# Patient Record
Sex: Female | Born: 1985 | Race: White | Hispanic: No | Marital: Married | Smoking: Former smoker
Health system: Southern US, Community
[De-identification: ages and names within clinical notes are randomized; demographics above are authoritative.]

## PROBLEM LIST (undated history)

## (undated) DIAGNOSIS — G43909 Migraine, unspecified, not intractable, without status migrainosus: Secondary | ICD-10-CM

## (undated) DIAGNOSIS — R42 Dizziness and giddiness: Secondary | ICD-10-CM

## (undated) DIAGNOSIS — F410 Panic disorder [episodic paroxysmal anxiety] without agoraphobia: Secondary | ICD-10-CM

## (undated) DIAGNOSIS — F419 Anxiety disorder, unspecified: Secondary | ICD-10-CM

## (undated) HISTORY — PX: OVARIAN CYST REMOVAL: SHX89

## (undated) HISTORY — PX: WISDOM TOOTH EXTRACTION: SHX21

---

## 2006-04-18 ENCOUNTER — Inpatient Hospital Stay: Payer: Self-pay

## 2013-04-29 ENCOUNTER — Ambulatory Visit: Payer: Self-pay | Admitting: Obstetrics and Gynecology

## 2013-04-29 LAB — CBC WITH DIFFERENTIAL/PLATELET
BASOS ABS: 0 10*3/uL (ref 0.0–0.1)
Basophil %: 0.4 %
EOS ABS: 0.1 10*3/uL (ref 0.0–0.7)
EOS PCT: 1.3 %
HCT: 41.2 % (ref 35.0–47.0)
HGB: 14.3 g/dL (ref 12.0–16.0)
LYMPHS ABS: 3.4 10*3/uL (ref 1.0–3.6)
Lymphocyte %: 33.6 %
MCH: 30.5 pg (ref 26.0–34.0)
MCHC: 34.6 g/dL (ref 32.0–36.0)
MCV: 88 fL (ref 80–100)
MONOS PCT: 5.4 %
Monocyte #: 0.5 x10 3/mm (ref 0.2–0.9)
Neutrophil #: 5.9 10*3/uL (ref 1.4–6.5)
Neutrophil %: 59.3 %
Platelet: 244 10*3/uL (ref 150–440)
RBC: 4.67 10*6/uL (ref 3.80–5.20)
RDW: 13.3 % (ref 11.5–14.5)
WBC: 10 10*3/uL (ref 3.6–11.0)

## 2013-04-29 LAB — URINALYSIS, COMPLETE
BILIRUBIN, UR: NEGATIVE
BLOOD: NEGATIVE
Glucose,UR: NEGATIVE mg/dL (ref 0–75)
Hyaline Cast: 6
Ketone: NEGATIVE
LEUKOCYTE ESTERASE: NEGATIVE
NITRITE: NEGATIVE
PH: 7 (ref 4.5–8.0)
PROTEIN: NEGATIVE
RBC,UR: 2 /HPF (ref 0–5)
Specific Gravity: 1.016 (ref 1.003–1.030)

## 2013-04-29 LAB — COMPREHENSIVE METABOLIC PANEL
ALT: 17 U/L (ref 12–78)
ANION GAP: 5 — AB (ref 7–16)
Albumin: 3.9 g/dL (ref 3.4–5.0)
Alkaline Phosphatase: 55 U/L
BILIRUBIN TOTAL: 0.5 mg/dL (ref 0.2–1.0)
BUN: 14 mg/dL (ref 7–18)
CALCIUM: 8.8 mg/dL (ref 8.5–10.1)
CHLORIDE: 106 mmol/L (ref 98–107)
CREATININE: 1.02 mg/dL (ref 0.60–1.30)
Co2: 26 mmol/L (ref 21–32)
EGFR (African American): >60
Glucose: 95 mg/dL (ref 65–99)
Osmolality: 274 (ref 275–301)
Potassium: 3.4 mmol/L — ABNORMAL LOW (ref 3.5–5.1)
SGOT(AST): 19 U/L (ref 15–37)
Sodium: 137 mmol/L (ref 136–145)
Total Protein: 6.9 g/dL (ref 6.4–8.2)

## 2013-04-29 LAB — LIPASE, BLOOD: LIPASE: 105 U/L (ref 73–393)

## 2013-05-04 LAB — PATHOLOGY REPORT

## 2014-08-05 NOTE — Op Note (Signed)
PATIENT NAME:  Tiffany Cuevas, Tiffany Cuevas MR#:  [id number] DATE OF BIRTH:  1986-02-11  DATE OF PROCEDURE:  04/29/2013  PREOPERATIVE DIAGNOSES: 1.  Right ovarian cyst (8 cm x 7 cm x 6 cm).  2.  Acute abdominal pain.  3.  Suspect ovarian torsion.   POSTOPERATIVE DIAGNOSES:  1.  Right ovarian cyst (8 cm x 7 cm x 6 cm).  2.  Acute abdominal pain.  3.  Suspect ovarian torsion.   PROCEDURES:  1.  Operative laparoscopy.  2.  Right ovarian cystectomy.   SURGEON: Thomasene MohairStephen Malie Kashani, MD   ASSISTANT SURGEON: Corky CraftsBrittany Pjetraj, PA-S  ANESTHESIA: General.  ESTIMATED BLOOD LOSS: 25 mL.  OPERATIVE FLUIDS: 80 mL.   COMPLICATIONS: None.   FINDINGS: 1.  Large right simple ovarian cyst.  2.  Torsed appearing infundibulopelvic ligament.  3.  Otherwise normal-appearing pelvic anatomy.  4.  Normal appearing appendix.   SPECIMEN: Right ovarian cyst.   CONDITION AT THE END OF PROCEDURE: Stable.   PROCEDURE IN DETAIL: The patient was taken to the operating room where general anesthesia was administered and found to be adequate. She was placed in the dorsal supine lithotomy position in [place] stirrups and prepped and draped in the usual sterile fashion. After Cuevas timeout was called, Foley catheter was placed in her bladder for decompression during the surgery. The sterile speculum was placed in the vagina and Cuevas single-tooth tenaculum was affixed to the anterior lip of the cervix. An acorn uterine manipulator was then affixed to the tenaculum.   Attention was turned to the abdomen where after injection of local anesthetic Cuevas 5 mm infraumbilical incision was made with Cuevas scalpel and the abdomen was entered using direct camera visualization using an Optiview trocar. Entry into the abdomen was verified using opening pressures. The abdomen was then insufflated with CO2. The camera was then re-introduced through the port and atraumatic entry was verified. Survey of the pelvis and abdomen was undertaken with the above-noted  findings. Right and left lower quadrant ports, both were 5 mm, were placed under direct intra-abdominal camera visualization without difficulty. The right ovary was elevated and de-torsed and there was noted to be no duskiness or apparent ischemia of the ovarian tissue. The ovarian cyst was opened up and the fluid was drained and evacuated using the suction irrigator. The majority of the cyst was removed including part of the ovarian capsule with the majority of the ovary still intact. The remaining ovary had the cyst wall removed from it and hemostasis was verified and obtained. The specimen was placed in the anterior cul-de-sac while general inspection of the rest of the pelvis and abdomen was undertaken. Once this was accomplished, the umbilical port was changed to Cuevas 12 mm port was placed and an Endo Catch bag was introduced through the umbilical port and the specimen was placed into the Endo Catch bag. The Endo Catch bag was then able to be removed directly through the port without having to remove the port. The camera was reintroduced through the umbilical port and the pelvis was copiously irrigated and evacuated of fluid. Hemostasis was noted at the ovary and good ovarian color was noted with ovary in normal anatomic position at the end of the case. This concluded the procedure. All port sites were removed and after desufflation of the abdomen each incision site was closed subcutaneously using 3-0 Vicryl and the skin was closed using Dermabond. Cuevas total of 18 mL of 0.5% Marcaine was injected divided along all 3 sites.  Once the abdominal portion of the procedure was terminated, Cuevas speculum was placed again in the vagina and acorn manipulator was removed from the tenaculum. The tenaculum was removed and using 1 silver nitrate stick one of the tenaculum sites required silver nitrate for hemostasis. The speculum was also removed and verification of absence of instruments that were in the vagina was verified. The  Foley catheter was removed also at the end of the procedure.   The patient tolerated the procedure well. Sponge, lap, and needle counts were correct x2. The patient was wearing pneumatic compression stockings throughout the entire procedure for VTE prophylaxis. She was awakened in the operating room and taken to the recovery area in stable condition.  ____________________________ Conard Novak, MD sdj:sb D: 04/29/2013 15:47:04 ET T: 04/29/2013 16:56:39 ET JOB#: [id number]  cc: Conard Novak, MD, <Dictator> Conard Novak MD ELECTRONICALLY SIGNED 05/20/2013 17:17

## 2014-08-22 NOTE — H&P (Signed)
L&D Evaluation:  History Expanded:  HPI 29 yo G2P2 healthy female who presents with sudden onset right lower quadrant pain at about 230 am today.  She had mild discomfort last night. She has never had this type of pain before.  She describes the pain as sharp/stabbling and radiating to her right flank and back.  No alleviating and no aggravating factors. Pain is 10/10. She notes some chills, but no fever.  +nausea, no emesis.  No diarrhea/constipation.  She had "pressure" symptoms when attempting to void.  She has an IUD in place for contraception.  She does not have a firm last menstrual period date.  She has had a couple of doses of morphine which have relieved the pain.   Patient's Medical History migraines   Patient's Surgical History none   Medications None   Allergies 1) Percocet- nausea/vomiting, 2) amoxicillin   Social History none   Family History Non-Contributory   ROS:  ROS All systems were reviewed.  HEENT, CNS, GI, GU, Respiratory, CV, Renal and Musculoskeletal systems were found to be normal., x 10 reviewed unless noted in HPI   Exam:  Vital Signs T 97.8, BP 99/55, P 93, 99%RA   General no apparent distress   Mental Status clear   Abdomen soft, mildly tender to palpation in her right lower quadrant, no rebound or guarding   Edema no edema   Skin no lesions   Other CT: 8x7x6cm right ovarian cyst with a possible single septation Pelvic U/S: confirms CT findings. Doppler shows arterial and venous flow without obvious surrounding edema.   Impression:  Impression right ovarian cyst, concern for ovarian torsion.   Plan:  Comments Discussed ovarian torsion as possible etiology to her symptoms and discussed risks/benefits of diagnostic and possibly therapeutic laparoscopy to remove or decompress cyst. Patient and husband had opportunity to discuss and have elected for Operative laparoscopy with removal/decompression of right ovarian cyst.  Patient last ate at around  8pm last night.  She has been NPO since then. Discussed risks/benefits of surgery.  Discussed recovery and what to expect post-operatively.   Reviewed blood consent form verbally and patient agrees to blood transfusion should one be necessary emergently.   Labs:  Lab Results:  Hepatic:  16-Jan-15 03:38   Bilirubin, Total 0.5  Alkaline Phosphatase 55 (45-117 NOTE: New Reference Range 03/04/13)  SGPT (ALT) 17  SGOT (AST) 19  Total Protein, Serum 6.9  Albumin, Serum 3.9  Routine Chem:  16-Jan-15 03:38   Lipase 105 (Result(s) reported on 29 Apr 2013 at 04:24AM.)  Glucose, Serum 95  BUN 14  Creatinine (comp) 1.02  Sodium, Serum 137  Potassium, Serum  3.4  Chloride, Serum 106  CO2, Serum 26  Calcium (Total), Serum 8.8  Osmolality (calc) 274  eGFR (African American) >60  eGFR (Non-African American) >60 (eGFR values <81m/min/1.73 m2 may be an indication of chronic kidney disease (CKD). Calculated eGFR is useful in patients with stable renal function. The eGFR calculation will not be reliable in acutely ill patients when serum creatinine is changing rapidly. It is not useful in  patients on dialysis. The eGFR calculation may not be applicable to patients at the low and high extremes of body sizes, pregnant women, and vegetarians.)  Anion Gap  5  Routine UA:  16-Jan-15 03:38   Color (UA) Yellow  Clarity (UA) Cloudy  Glucose (UA) Negative  Bilirubin (UA) Negative  Ketones (UA) Negative  Specific Gravity (UA) 1.016  Blood (UA) Negative  pH (UA) 7.0  Protein (UA) Negative  Nitrite (UA) Negative  Leukocyte Esterase (UA) Negative (Result(s) reported on 29 Apr 2013 at 04:39AM.)  RBC (UA) 2 /HPF  WBC (UA) 2 /HPF  Bacteria (UA) 2+  Epithelial Cells (UA) 2 /HPF  Mucous (UA) PRESENT  Hyaline Cast (UA) 6 /LPF  Amorphous Crystal (UA) PRESENT (Result(s) reported on 29 Apr 2013 at 04:39AM.)  Routine Hem:  16-Jan-15 03:38   WBC (CBC) 10.0  RBC (CBC) 4.67  Hemoglobin (CBC) 14.3   Hematocrit (CBC) 41.2  Platelet Count (CBC) 244  MCV 88  MCH 30.5  MCHC 34.6  RDW 13.3  Neutrophil % 59.3  Lymphocyte % 33.6  Monocyte % 5.4  Eosinophil % 1.3  Basophil % 0.4  Neutrophil # 5.9  Lymphocyte # 3.4  Monocyte # 0.5  Eosinophil # 0.1  Basophil # 0.0 (Result(s) reported on 29 Apr 2013 at 04:24AM.)   Electronic Signatures: Will Bonnet (MD)  (Signed 16-Jan-15 09:51)  Authored: L&D Evaluation, Labs   Last Updated: 16-Jan-15 09:51 by Will Bonnet (MD)

## 2015-02-21 ENCOUNTER — Emergency Department
Admission: EM | Admit: 2015-02-21 | Discharge: 2015-02-22 | Disposition: A | Discharge status: Home / Self Care | Payer: BC Managed Care – PPO | Attending: Emergency Medicine | Admitting: Emergency Medicine

## 2015-02-21 ENCOUNTER — Encounter: Payer: Self-pay | Admitting: *Deleted

## 2015-02-21 DIAGNOSIS — G43909 Migraine, unspecified, not intractable, without status migrainosus: Secondary | ICD-10-CM | POA: Diagnosis present

## 2015-02-21 DIAGNOSIS — G43009 Migraine without aura, not intractable, without status migrainosus: Secondary | ICD-10-CM

## 2015-02-21 HISTORY — DX: Migraine, unspecified, not intractable, without status migrainosus: G43.909

## 2015-02-21 NOTE — ED Notes (Addendum)
Pt reports a migraine headache for 1 day.  states headache feels different from others.  Pt has taken 3 imitrex without relief today.  Pt also has nausea   Pt alert.  Speech clear.

## 2015-02-22 ENCOUNTER — Encounter: Payer: Self-pay | Admitting: Emergency Medicine

## 2015-02-22 ENCOUNTER — Emergency Department: Payer: BC Managed Care – PPO

## 2015-02-22 ENCOUNTER — Encounter: Payer: Self-pay | Admitting: *Deleted

## 2015-02-22 ENCOUNTER — Emergency Department
Admission: EM | Admit: 2015-02-22 | Discharge: 2015-02-22 | Disposition: A | Discharge status: Home / Self Care | Payer: BC Managed Care – PPO | Source: Home / Self Care | Attending: Emergency Medicine | Admitting: Emergency Medicine

## 2015-02-22 DIAGNOSIS — G43909 Migraine, unspecified, not intractable, without status migrainosus: Secondary | ICD-10-CM | POA: Diagnosis not present

## 2015-02-22 DIAGNOSIS — G43009 Migraine without aura, not intractable, without status migrainosus: Secondary | ICD-10-CM

## 2015-02-22 MED ORDER — ONDANSETRON 4 MG PO TBDP
ORAL_TABLET | ORAL | Status: AC
  Filled 2015-02-22: qty 1

## 2015-02-22 MED ORDER — ONDANSETRON HCL 4 MG/2ML IJ SOLN
4.0000 mg | Freq: Once | INTRAMUSCULAR | Status: AC
Start: 2015-02-22 — End: 2015-02-22
  Administered 2015-02-22: 4 mg via INTRAVENOUS

## 2015-02-22 MED ORDER — ACETAMINOPHEN 500 MG PO TABS
1000.0000 mg | ORAL_TABLET | Freq: Once | ORAL | Status: AC
  Administered 2015-02-22: 1000 mg via ORAL
  Filled 2015-02-22: qty 2

## 2015-02-22 MED ORDER — SODIUM CHLORIDE 0.9 % IV BOLUS (SEPSIS)
1000.0000 mL | Freq: Once | INTRAVENOUS | Status: AC
  Administered 2015-02-22: 1000 mL via INTRAVENOUS

## 2015-02-22 MED ORDER — METOCLOPRAMIDE HCL 5 MG/ML IJ SOLN
20.0000 mg | Freq: Once | INTRAVENOUS | Status: AC
  Administered 2015-02-22: 20 mg via INTRAVENOUS
  Filled 2015-02-22: qty 4

## 2015-02-22 MED ORDER — ONDANSETRON 4 MG PO TBDP
4.0000 mg | ORAL_TABLET | Freq: Once | ORAL | Status: AC | PRN
  Administered 2015-02-22: 4 mg via ORAL

## 2015-02-22 MED ORDER — OXYCODONE-ACETAMINOPHEN 5-325 MG PO TABS
2.0000 | ORAL_TABLET | Freq: Once | ORAL | Status: AC
  Administered 2015-02-22: 2 via ORAL
  Filled 2015-02-22: qty 2

## 2015-02-22 MED ORDER — ONDANSETRON 4 MG PO TBDP: 4.0000 mg | ORAL_TABLET | Freq: Three times a day (TID) | ORAL | Status: DC | PRN

## 2015-02-22 MED ORDER — KETOROLAC TROMETHAMINE 30 MG/ML IJ SOLN
30.0000 mg | Freq: Once | INTRAMUSCULAR | Status: AC
  Administered 2015-02-22: 30 mg via INTRAVENOUS
  Filled 2015-02-22: qty 1

## 2015-02-22 MED ORDER — OXYCODONE-ACETAMINOPHEN 5-325 MG PO TABS
1.0000 | ORAL_TABLET | Freq: Once | ORAL | Status: AC
  Administered 2015-02-22: 1 via ORAL

## 2015-02-22 MED ORDER — ONDANSETRON HCL 4 MG/2ML IJ SOLN
INTRAMUSCULAR | Status: AC
  Administered 2015-02-22: 4 mg via INTRAVENOUS
  Filled 2015-02-22: qty 2

## 2015-02-22 MED ORDER — DIPHENHYDRAMINE HCL 50 MG/ML IJ SOLN
25.0000 mg | Freq: Once | INTRAMUSCULAR | Status: AC
  Administered 2015-02-22: 25 mg via INTRAVENOUS
  Filled 2015-02-22: qty 1

## 2015-02-22 MED ORDER — OXYCODONE-ACETAMINOPHEN 5-325 MG PO TABS: 1.0000 | ORAL_TABLET | ORAL | Status: DC | PRN

## 2015-02-22 MED ORDER — SODIUM CHLORIDE 0.9 % IV BOLUS (SEPSIS)
500.0000 mL | INTRAVENOUS | Status: AC
  Administered 2015-02-22: 500 mL via INTRAVENOUS

## 2015-02-22 MED ORDER — OXYCODONE-ACETAMINOPHEN 5-325 MG PO TABS
ORAL_TABLET | ORAL | Status: AC
  Filled 2015-02-22: qty 1

## 2015-02-22 MED ORDER — ONDANSETRON 4 MG PO TBDP
4.0000 mg | ORAL_TABLET | Freq: Once | ORAL | Status: AC
  Administered 2015-02-22: 4 mg via ORAL
  Filled 2015-02-22: qty 1

## 2015-02-22 MED ORDER — MORPHINE SULFATE (PF) 4 MG/ML IV SOLN
4.0000 mg | Freq: Once | INTRAVENOUS | Status: AC
  Administered 2015-02-22: 4 mg via INTRAVENOUS

## 2015-02-22 MED ORDER — MORPHINE SULFATE (PF) 4 MG/ML IV SOLN
INTRAVENOUS | Status: AC
  Administered 2015-02-22: 4 mg via INTRAVENOUS
  Filled 2015-02-22: qty 1

## 2015-02-22 MED ORDER — DEXAMETHASONE SODIUM PHOSPHATE 10 MG/ML IJ SOLN
10.0000 mg | Freq: Once | INTRAMUSCULAR | Status: AC
  Administered 2015-02-22: 10 mg via INTRAVENOUS
  Filled 2015-02-22: qty 1

## 2015-02-22 NOTE — ED Notes (Signed)
Patient discharged to home per MD order. Patient in stable condition, and deemed medically cleared by ED provider for discharge. Discharge instructions reviewed with patient/family using "Teach Back"; verbalized understanding of medication education and administration, and information about follow-up care. Denies further concerns. ° °

## 2015-02-22 NOTE — ED Notes (Addendum)
Pt was seen last night for a migraine, pt woke up this morning with a migraine and vomiting

## 2015-02-22 NOTE — ED Notes (Signed)
Pt to ct 

## 2015-02-22 NOTE — ED Provider Notes (Signed)
[facility] Emergency Department Provider Note  ____________________________________________  Time seen: 12:30 AM  I have reviewed the triage vital signs and the nursing notes.   HISTORY  Chief Complaint Migraine     HPI Karie ChimeraMegan A Prosperi is a 29 y.o. female presents with migraine headache times one day that is unrelieved with 3 doses of Imitrex. Patient also admits to nausea and dizziness. Patient states that this current headache is different from her migraines that she's been having since childhood. Patient also admits to photosensitivity     Past Medical History  Diagnosis Date  . Migraines     There are no active problems to display for this patient.   No past surgical history on file.  No current outpatient prescriptions on file.  Allergies Review of patient's allergies indicates no known allergies.  No family history on file.  Social History Social History  Substance Use Topics  . Smoking status: Never Smoker   . Smokeless tobacco: None  . Alcohol Use: No    Review of Systems  Constitutional: Negative for fever. Eyes: Negative for visual changes. ENT: Negative for sore throat. Cardiovascular: Negative for chest pain. Respiratory: Negative for shortness of breath. Gastrointestinal: Negative for abdominal pain, vomiting and diarrhea. Genitourinary: Negative for dysuria. Musculoskeletal: Negative for back pain. Skin: Negative for rash. Neurological: Positive for headache and dizziness   10-point ROS otherwise negative.  ____________________________________________   PHYSICAL EXAM:  VITAL SIGNS: ED Triage Vitals  Enc Vitals Group     BP 02/21/15 2205 131/91 mmHg     Pulse Rate 02/21/15 2205 85     Resp 02/21/15 2205 18     Temp 02/21/15 2205 97.8 F (36.6 C)     Temp Source 02/21/15 2205 Oral     SpO2 02/21/15 2205 99 %     Weight 02/21/15 2205 160 lb (72.576 kg)     Height 02/21/15 2205 5\' 3"  (1.6 m)     Head Cir  --      Peak Flow --      Pain Score 02/21/15 2207 9     Pain Loc --      Pain Edu? --      Excl. in GC? --      Constitutional: Alert and oriented. Apparent discomfort Eyes: Conjunctivae are normal. PERRL. Normal extraocular movements. ENT   Head: Normocephalic and atraumatic.   Nose: No congestion/rhinnorhea.   Mouth/Throat: Mucous membranes are moist.   Neck: No stridor. Hematological/Lymphatic/Immunilogical: No cervical lymphadenopathy. Cardiovascular: Normal rate, regular rhythm. Normal and symmetric distal pulses are present in all extremities. No murmurs, rubs, or gallops. Respiratory: Normal respiratory effort without tachypnea nor retractions. Breath sounds are clear and equal bilaterally. No wheezes/rales/rhonchi. Gastrointestinal: Soft and nontender. No distention. There is no CVA tenderness. Genitourinary: deferred Musculoskeletal: Nontender with normal range of motion in all extremities. No joint effusions.  No lower extremity tenderness nor edema. Neurologic:  Normal speech and language. No gross focal neurologic deficits are appreciated. Speech is normal.  Skin:  Skin is warm, dry and intact. No rash noted. Psychiatric: Mood and affect are normal. Speech and behavior are normal. Patient exhibits appropriate insight and judgment.    RADIOLOGY  CT Head Wo Contrast (Final result) Result time: 02/22/15 01:15:59   Final result by Rad Results In Interface (02/22/15 01:15:59)   Narrative:   CLINICAL DATA: Headache, dizziness, nausea  EXAM: CT HEAD WITHOUT CONTRAST  TECHNIQUE: Contiguous axial images were obtained from the base of the skull  through the vertex without intravenous contrast.  COMPARISON: None currently available (11/25/2003)  FINDINGS: Skull and Sinuses:Negative for fracture or destructive process. The mastoids, middle ears, and imaged paranasal sinuses are clear.  Orbits: No acute abnormality.  Brain: No infarction, hemorrhage,  hydrocephalus, or mass lesion/mass effect. No abnormal vessel density.  IMPRESSION: Normal head CT.   Electronically Signed By: Marnee Spring M.D. On: 02/22/2015 01:15      INITIAL IMPRESSION / ASSESSMENT AND PLAN / ED COURSE  Pertinent labs & imaging results that were available during my care of the patient were reviewed by me and considered in my medical decision making (see chart for details).  Patient received 4 mg IV morphine with resolution of headache.____________________________________________   FINAL CLINICAL IMPRESSION(S) / ED DIAGNOSES  Final diagnoses:  Migraine without aura and without status migrainosus, not intractable      Darci Current, MD 02/22/15 0200

## 2015-02-22 NOTE — ED Provider Notes (Signed)
[facility] Emergency Department Provider Note  ____________________________________________  Time seen: Approximately 2:04 PM  I have reviewed the triage vital signs and the nursing notes.   HISTORY  Chief Complaint Migraine    HPI Tiffany Cuevas is a 29 y.o. female who reports a history of "headaches since I was 29 years old", whose migraines are managed by her OB/GYN with Imitrex, and who presents to the emergency department yesterday for a gradual onset severe migraine yesterday.  She reports back to the emergency department today with recurrence of a migraine.  She reports that she has had nearly daily headaches for about one month.  She awoke with a headache yesterday morning which gradually got worse over the course of the day.  It is severe, bilateral, and extends from behind the eyes through to the back of her head.She reports severe photophobia, nausea, and a couple of episodes of vomiting.  Movement and light and noise make it worse and nothing makes it better.  The Imitrex has not helped.  When she was in the emergency department last night she improved after a dose of morphine 4 mg IV and Percocet but when she awoke this morning the headache was back and she vomited again.  She has not seen a neurologist for management of her migraines but is interested in doing so given her persistent issues.  She denies any blurry vision or other visual changes.  She states that this headache is much more severe than headaches with which she has been dealing even though she has been having problems for about one month now.  She denies any infectious symptoms including fever/chills, chest pain, shortness of breath, abdominal pain, and dysuria.  She uses an IUD for contraception.   Past Medical History  Diagnosis Date  . Migraines     There are no active problems to display for this patient.   History reviewed. No pertinent past surgical history.  Current Outpatient  Rx  Name  Route  Sig  Dispense  Refill  . ondansetron (ZOFRAN-ODT) 4 MG disintegrating tablet   Oral   Take 1 tablet (4 mg total) by mouth every 8 (eight) hours as needed for nausea or vomiting.   20 tablet   0   . oxyCODONE-acetaminophen (PERCOCET/ROXICET) 5-325 MG tablet   Oral   Take 1 tablet by mouth every 4 (four) hours as needed for severe pain.   15 tablet   0     Allergies Review of patient's allergies indicates no known allergies.  No family history on file.  Social History Social History  Substance Use Topics  . Smoking status: Never Smoker   . Smokeless tobacco: None  . Alcohol Use: No    Review of Systems Constitutional: No fever/chills Eyes: Severe photophobia.  No visual loss or blurry vision. ENT: No sore throat. Cardiovascular: Denies chest pain. Respiratory: Denies shortness of breath. Gastrointestinal: No abdominal pain.  Nausea and infrequent vomiting.  No diarrhea.  No constipation. Genitourinary: Negative for dysuria. Musculoskeletal: Negative for back pain. Skin: Negative for rash. Neurological: Severe global headache emanating from the front to the back  10-point ROS otherwise negative.  ____________________________________________   PHYSICAL EXAM:  VITAL SIGNS: ED Triage Vitals  Enc Vitals Group     BP 02/22/15 1115 110/77 mmHg     Pulse Rate 02/22/15 1115 82     Resp 02/22/15 1115 20     Temp 02/22/15 1115 97.7 F (36.5 C)     Temp  Source 02/22/15 1115 Oral     SpO2 02/22/15 1115 98 %     Weight 02/22/15 1115 160 lb (72.576 kg)     Height 02/22/15 1115  (1.6 m)     Head Cir --      Peak Flow --      Pain Score 02/22/15 1116 10     Pain Loc --      Pain Edu? --      Excl. in GC? --     Constitutional: Alert and oriented.  Appears uncomfortable and is shielding her eyes from the light  Eyes: Conjunctivae are normal. PERRL. EOMI. funduscopic exam is normal with no evidence of papilledema.  Positive for photophobia. Head:  Atraumatic. Nose: No congestion/rhinnorhea.  Sinuses nontender to palpation Mouth/Throat: Mucous membranes are moist.  Oropharynx non-erythematous. Neck: No stridor.  No meningismus Cardiovascular: Normal rate, regular rhythm. Grossly normal heart sounds.  Good peripheral circulation. Respiratory: Normal respiratory effort.  No retractions. Lungs CTAB. Gastrointestinal: Soft and nontender. No distention. No abdominal bruits. No CVA tenderness. Musculoskeletal: No lower extremity tenderness nor edema.  No joint effusions. Neurologic:  Normal speech and language. No gross focal neurologic deficits are appreciated.  Skin:  Skin is warm, dry and intact. No rash noted. Psychiatric: Mood and affect are flat. Speech and behavior are normal.  ____________________________________________   LABS (all labs ordered are listed, but only abnormal results are displayed)  Labs Reviewed - No data to display ____________________________________________  EKG  Not indicated ____________________________________________  RADIOLOGY  (CT FROM YESTERDAY'S VISIT) Ct Head Wo Contrast  02/22/2015  CLINICAL DATA:  Headache, dizziness, nausea EXAM: CT HEAD WITHOUT CONTRAST TECHNIQUE: Contiguous axial images were obtained from the base of the skull through the vertex without intravenous contrast. COMPARISON:  None currently available (11/25/2003) FINDINGS: Skull and Sinuses:Negative for fracture or destructive process. The mastoids, middle ears, and imaged paranasal sinuses are clear. Orbits: No acute abnormality. Brain: No infarction, hemorrhage, hydrocephalus, or mass lesion/mass effect. No abnormal vessel density. IMPRESSION: Normal head CT. Electronically Signed   By: Marnee Spring M.D.   On: 02/22/2015 01:15    ____________________________________________   PROCEDURES  Procedure(s) performed: None  Critical Care performed: No ____________________________________________   INITIAL IMPRESSION /  ASSESSMENT AND PLAN / ED COURSE  Pertinent labs & imaging results that were available during my care of the patient were reviewed by me and considered in my medical decision making (see chart for details).  Though it is possible that the patient has a different cause of headache other than migraine, it is less likely; she has been dealing with migraines her entire life, they have been present for nearly a month, she has no neurological deficits including no visual changes, she has no evidence of papilledema.  Differential diagnosis includes but is not exclusive to subarachnoid hemorrhage, meningitis, encephalitis, previous head trauma, psuedotumor cerebri, cavernous venous thrombosis, muscle tension headache, temporal arteritis, migraine or migraine equivalent, but the lack of infectious symptoms, the normal CT scan yesterday, and the description mentioned previously make the more severe causes significantly less likely.  The patient received opioids yesterday, but today I will treat her with a full "migraine cocktail" as per current up-to-date recommendations and then reassess.  ----------------------------------------- 4:24 PM on 02/22/2015 -----------------------------------------  The patient is currently sleeping comfortably.  I had a discussion about the difficulty in treating severe migraines with her husband.  I advised close follow-up with a neurologist to develop a care plan for her since the  Imitrex is not working as well as it used to.  Again, I considered other, possibly more severe diagnoses, but the non-intractable nature of her pain and all of the reassuring findings as described above make them much less likely, and though she was in pain when she arrives, she is generally well-appearing with normal vital signs and no neurological deficits.  I believe she is appropriate for outpatient follow-up and that the risks of an invasive procedure such as a lumbar puncture or expensive and likely  unnecessary testing such as an MRV outweigh the benefit to the patient given the very low probability of these other etiologies. ____________________________________________  FINAL CLINICAL IMPRESSION(S) / ED DIAGNOSES  Final diagnoses:  Nonintractable migraine, unspecified migraine type      NEW MEDICATIONS STARTED DURING THIS VISIT:  Discharge Medication List as of 02/22/2015  4:28 PM       Loleta Rose, MD 02/22/15 2239

## 2015-02-22 NOTE — ED Notes (Signed)
Pt in with co headache since this AM hx of migraines but states pain is different.  Pain radiates to left face and makes her face feel "puffy".  No obvious swelling noted, states pain is 8/10 at this time with nausea and photosensitivity. Has taken imitrex today without relief.  Also co dizziness which is unusual with her headaches.

## 2015-02-22 NOTE — ED Notes (Signed)
Called pharmacy to send patient medication. 

## 2015-02-22 NOTE — Discharge Instructions (Signed)

## 2015-02-22 NOTE — Discharge Instructions (Signed)
You have been seen in the Emergency Department (ED) for a migraine.  Please use Tylenol or Motrin as needed for symptoms, but only as written on the box, and take any regular medications that have been prescribed for you. °As we have discussed, please follow up with your doctor as soon as possible regarding today’s ED visit and your headache symptoms.   ° °Call your doctor or return to the Emergency Department (ED) if you have a worsening headache, sudden and severe headache, confusion, slurred speech, facial droop, weakness or numbness in any arm or leg, extreme fatigue, or other symptoms that concern you. ° ° °Migraine Headache °A migraine headache is an intense, throbbing pain on one or both sides of your head. A migraine can last for 30 minutes to several hours. °CAUSES  °The exact cause of a migraine headache is not always known. However, a migraine may be caused when nerves in the brain become irritated and release chemicals that cause inflammation. This causes pain. °Certain things may also trigger migraines, such as: °· Alcohol. °· Smoking. °· Stress. °· Menstruation. °· Aged cheeses. °· Foods or drinks that contain nitrates, glutamate, aspartame, or tyramine. °· Lack of sleep. °· Chocolate. °· Caffeine. °· Hunger. °· Physical exertion. °· Fatigue. °· Medicines used to treat chest pain (nitroglycerine), birth control pills, estrogen, and some blood pressure medicines. °SIGNS AND SYMPTOMS °· Pain on one or both sides of your head. °· Pulsating or throbbing pain. °· Severe pain that prevents daily activities. °· Pain that is aggravated by any physical activity. °· Nausea, vomiting, or both. °· Dizziness. °· Pain with exposure to bright lights, loud noises, or activity. °· General sensitivity to bright lights, loud noises, or smells. °Before you get a migraine, you may get warning signs that a migraine is coming (aura). An aura may include: °· Seeing flashing lights. °· Seeing bright spots, halos, or zigzag  lines. °· Having tunnel vision or blurred vision. °· Having feelings of numbness or tingling. °· Having trouble talking. °· Having muscle weakness. °DIAGNOSIS  °A migraine headache is often diagnosed based on: °· Symptoms. °· Physical exam. °· A CT scan or MRI of your head. These imaging tests cannot diagnose migraines, but they can help rule out other causes of headaches. °TREATMENT °Medicines may be given for pain and nausea. Medicines can also be given to help prevent recurrent migraines.  °HOME CARE INSTRUCTIONS °· Only take over-the-counter or prescription medicines for pain or discomfort as directed by your health care provider. The use of long-term narcotics is not recommended. °· Lie down in a dark, quiet room when you have a migraine. °· Keep a journal to find out what may trigger your migraine headaches. For example, write down: °¨ What you eat and drink. °¨ How much sleep you get. °¨ Any change to your diet or medicines. °· Limit alcohol consumption. °· Quit smoking if you smoke. °· Get 7-9 hours of sleep, or as recommended by your health care provider. °· Limit stress. °· Keep lights dim if bright lights bother you and make your migraines worse. °SEEK IMMEDIATE MEDICAL CARE IF:  °· Your migraine becomes severe. °· You have a fever. °· You have a stiff neck. °· You have vision loss. °· You have muscular weakness or loss of muscle control. °· You start losing your balance or have trouble walking. °· You feel faint or pass out. °· You have severe symptoms that are different from your first symptoms. °MAKE SURE YOU:  °·   Understand these instructions. °· Will watch your condition. °· Will get help right away if you are not doing well or get worse. °  °This information is not intended to replace advice given to you by your health care provider. Make sure you discuss any questions you have with your health care provider. °  °Document Released: 03/31/2005 Document Revised: 04/21/2014 Document Reviewed:  12/06/2012 °Elsevier Interactive Patient Education ©2016 [employer] ° ° ° °

## 2015-07-21 ENCOUNTER — Ambulatory Visit (INDEPENDENT_AMBULATORY_CARE_PROVIDER_SITE_OTHER): Payer: BC Managed Care – PPO

## 2015-07-21 ENCOUNTER — Ambulatory Visit
Admission: EM | Admit: 2015-07-21 | Discharge: 2015-07-21 | Disposition: A | Discharge status: Home / Self Care | Payer: BC Managed Care – PPO | Attending: Family Medicine | Admitting: Family Medicine

## 2015-07-21 DIAGNOSIS — S8012XA Contusion of left lower leg, initial encounter: Secondary | ICD-10-CM

## 2015-07-21 DIAGNOSIS — S40022A Contusion of left upper arm, initial encounter: Secondary | ICD-10-CM

## 2015-07-21 DIAGNOSIS — S0093XA Contusion of unspecified part of head, initial encounter: Secondary | ICD-10-CM | POA: Diagnosis not present

## 2015-07-21 DIAGNOSIS — S161XXA Strain of muscle, fascia and tendon at neck level, initial encounter: Secondary | ICD-10-CM

## 2015-07-21 MED ORDER — CYCLOBENZAPRINE HCL 10 MG PO TABS: 10.0000 mg | ORAL_TABLET | Freq: Every day | ORAL | Status: DC

## 2015-07-21 NOTE — ED Notes (Signed)
Pt fell yesterday, and injured head. Pt is now c/o H/A, neck pain, some blurred vision.

## 2015-07-21 NOTE — Discharge Instructions (Signed)
Contusion °A contusion is a deep bruise. Contusions are the result of a blunt injury to tissues and muscle fibers under the skin. The injury causes bleeding under the skin. The skin overlying the contusion may turn blue, purple, or yellow. Minor injuries will give you a painless contusion, but more severe contusions may stay painful and swollen for a few weeks.  °CAUSES  °This condition is usually caused by a blow, trauma, or direct force to an area of the body. °SYMPTOMS  °Symptoms of this condition include: °· Swelling of the injured area. °· Pain and tenderness in the injured area. °· Discoloration. The area may have redness and then turn blue, purple, or yellow. °DIAGNOSIS  °This condition is diagnosed based on a physical exam and medical history. An X-ray, CT scan, or MRI may be needed to determine if there are any associated injuries, such as broken bones (fractures). °TREATMENT  °Specific treatment for this condition depends on what area of the body was injured. In general, the best treatment for a contusion is resting, icing, applying pressure to (compression), and elevating the injured area. This is often called the RICE strategy. Over-the-counter anti-inflammatory medicines may also be recommended for pain control.  °HOME CARE INSTRUCTIONS  °· Rest the injured area. °· If directed, apply ice to the injured area: °· Put ice in a plastic bag. °· Place a towel between your skin and the bag. °· Leave the ice on for 20 minutes, 2-3 times per day. °· If directed, apply light compression to the injured area using an elastic bandage. Make sure the bandage is not wrapped too tightly. Remove and reapply the bandage as directed by your health care provider. °· If possible, raise (elevate) the injured area above the level of your heart while you are sitting or lying down. °· Take over-the-counter and prescription medicines only as told by your health care provider. °SEEK MEDICAL CARE IF: °· Your symptoms do not  improve after several days of treatment. °· Your symptoms get worse. °· You have difficulty moving the injured area. °SEEK IMMEDIATE MEDICAL CARE IF:  °· You have severe pain. °· You have numbness in a hand or foot. °· Your hand or foot turns pale or cold. °  °This information is not intended to replace advice given to you by your health care provider. Make sure you discuss any questions you have with your health care provider. °  °Document Released: 01/08/2005 Document Revised: 12/20/2014 Document Reviewed: 08/16/2014 °Elsevier Interactive Patient Education ©2016 [employer] °Cervical Sprain °A cervical sprain is an injury in the neck in which the strong, fibrous tissues (ligaments) that connect your neck bones stretch or tear. Cervical sprains can range from mild to severe. Severe cervical sprains can cause the neck vertebrae to be unstable. This can lead to damage of the spinal cord and can result in serious nervous system problems. The amount of time it takes for a cervical sprain to get better depends on the cause and extent of the injury. Most cervical sprains heal in 1 to 3 weeks. °CAUSES  °Severe cervical sprains may be caused by:  °· Contact sport injuries (such as from football, rugby, wrestling, hockey, auto racing, gymnastics, diving, martial arts, or boxing).   °· Motor vehicle collisions.   °· Whiplash injuries. This is an injury from a sudden forward and backward whipping movement of the head and neck.  °· Falls.   °Mild cervical sprains may be caused by:  °· Being in an awkward position, such as while   cradling a telephone between your ear and shoulder.   °· Sitting in a chair that does not offer proper support.   °· Working at a poorly designed computer station.   °· Looking up or down for long periods of time.   °SYMPTOMS  °· Pain, soreness, stiffness, or a burning sensation in the front, back, or sides of the neck. This discomfort may develop immediately after the injury or slowly, 24 hours or  more after the injury.   °· Pain or tenderness directly in the middle of the back of the neck.   °· Shoulder or upper back pain.   °· Limited ability to move the neck.   °· Headache.   °· Dizziness.   °· Weakness, numbness, or tingling in the hands or arms.   °· Muscle spasms.   °· Difficulty swallowing or chewing.   °· Tenderness and swelling of the neck.   °DIAGNOSIS  °Most of the time your health care provider can diagnose a cervical sprain by taking your history and doing a physical exam. Your health care provider will ask about previous neck injuries and any known neck problems, such as arthritis in the neck. X-rays may be taken to find out if there are any other problems, such as with the bones of the neck. Other tests, such as a CT scan or MRI, may also be needed.  °TREATMENT  °Treatment depends on the severity of the cervical sprain. Mild sprains can be treated with rest, keeping the neck in place (immobilization), and pain medicines. Severe cervical sprains are immediately immobilized. Further treatment is done to help with pain, muscle spasms, and other symptoms and may include: °· Medicines, such as pain relievers, numbing medicines, or muscle relaxants.   °· Physical therapy. This may involve stretching exercises, strengthening exercises, and posture training. Exercises and improved posture can help stabilize the neck, strengthen muscles, and help stop symptoms from returning.   °HOME CARE INSTRUCTIONS  °· Put ice on the injured area.   °¨ Put ice in a plastic bag.   °¨ Place a towel between your skin and the bag.   °¨ Leave the ice on for 15-20 minutes, 3-4 times a day.   °· If your injury was severe, you may have been given a cervical collar to wear. A cervical collar is a two-piece collar designed to keep your neck from moving while it heals. °¨ Do not remove the collar unless instructed by your health care provider. °¨ If you have long hair, keep it outside of the collar. °¨ Ask your health care  provider before making any adjustments to your collar. Minor adjustments may be required over time to improve comfort and reduce pressure on your chin or on the back of your head. °¨ If you are allowed to remove the collar for cleaning or bathing, follow your health care provider's instructions on how to do so safely. °¨ Keep your collar clean by wiping it with mild soap and water and drying it completely. If the collar you have been given includes removable pads, remove them every 1-2 days and hand wash them with soap and water. Allow them to air dry. They should be completely dry before you wear them in the collar. °¨ If you are allowed to remove the collar for cleaning and bathing, wash and dry the skin of your neck. Check your skin for irritation or sores. If you see any, tell your health care provider. °¨ Do not drive while wearing the collar.   °· Only take over-the-counter or prescription medicines for pain, discomfort, or fever as directed by your health care   provider.   °· Keep all follow-up appointments as directed by your health care provider.   °· Keep all physical therapy appointments as directed by your health care provider.   °· Make any needed adjustments to your workstation to promote good posture.   °· Avoid positions and activities that make your symptoms worse.   °· Warm up and stretch before being active to help prevent problems.   °SEEK MEDICAL CARE IF:  °· Your pain is not controlled with medicine.   °· You are unable to decrease your pain medicine over time as planned.   °· Your activity level is not improving as expected.   °SEEK IMMEDIATE MEDICAL CARE IF:  °· You develop any bleeding. °· You develop stomach upset. °· You have signs of an allergic reaction to your medicine.   °· Your symptoms get worse.   °· You develop new, unexplained symptoms.   °· You have numbness, tingling, weakness, or paralysis in any part of your body.   °MAKE SURE YOU:  °· Understand these instructions. °· Will  watch your condition. °· Will get help right away if you are not doing well or get worse. °  °This information is not intended to replace advice given to you by your health care provider. Make sure you discuss any questions you have with your health care provider. °  °Document Released: 01/26/2007 Document Revised: 04/05/2013 Document Reviewed: 10/06/2012 °Elsevier Interactive Patient Education ©2016 [employer]vier Inc. ° °

## 2015-07-21 NOTE — ED Provider Notes (Signed)
CSN: [id number]     Arrival date & time 07/21/15  1406 History   First MD Initiated Contact with Patient 07/21/15 1623     Chief Complaint  Patient presents with  . Fall  . Head Injury   (Consider location/radiation/quality/duration/timing/severity/associated sxs/prior Treatment) HPI Comments: 30 yo female presents with a c/o neck pain and headache after falling yesterday. Denies any loss of vision, loss of consciousness, vomiting, seizures, numbness/tingling, difficulty with speech or swallowing.    The history is provided by the patient.    Past Medical History  Diagnosis Date  . Migraines    No past surgical history on file. No family history on file. Social History  Substance Use Topics  . Smoking status: Never Smoker   . Smokeless tobacco: None  . Alcohol Use: No   OB History    No data available     Review of Systems  Allergies  Percocet  Home Medications   Prior to Admission medications   Medication Sig Start Date End Date Taking? Authorizing Provider  nortriptyline (PAMELOR) 25 MG capsule Take 25 mg by mouth at bedtime.   Yes Historical Provider, MD  SUMAtriptan (IMITREX) 50 MG tablet Take 50 mg by mouth every 2 (two) hours as needed for migraine. May repeat in 2 hours if headache persists or recurs.   Yes Historical Provider, MD  cyclobenzaprine (FLEXERIL) 10 MG tablet Take 1 tablet (10 mg total) by mouth at bedtime. 07/21/15   Payton Mccallum, MD  ondansetron (ZOFRAN-ODT) 4 MG disintegrating tablet Take 1 tablet (4 mg total) by mouth every 8 (eight) hours as needed for nausea or vomiting. 02/22/15   Darci Current, MD  oxyCODONE-acetaminophen (PERCOCET/ROXICET) 5-325 MG tablet Take 1 tablet by mouth every 4 (four) hours as needed for severe pain. 02/22/15   Darci Current, MD   Meds Ordered and Administered this Visit  Medications - No data to display  BP 116/76 mmHg  Pulse 77  Temp(Src) 97.8 F (36.6 C) (Oral)  Resp 16  Ht  (1.626 m)  Wt 175 lb  (79.379 kg)  BMI 30.02 kg/m2  SpO2 100%  LMP  No data found.   Physical Exam  Constitutional: She is oriented to person, place, and time. She appears well-developed and well-nourished. No distress.  HENT:  Head: Normocephalic and atraumatic.  Right Ear: External ear normal.  Left Ear: External ear normal.  Mouth/Throat: Oropharynx is clear and moist.  Eyes: Conjunctivae and EOM are normal. Pupils are equal, round, and reactive to light.  Neck: Normal range of motion. Neck supple. No tracheal deviation present. No thyromegaly present.  Cardiovascular: Normal rate, regular rhythm and normal heart sounds.   Pulmonary/Chest: No stridor.  Musculoskeletal: She exhibits no edema.       Cervical back: She exhibits tenderness (over the cervical paraspinous muscles), bony tenderness (over the cervical spine) and spasm. She exhibits normal range of motion, no swelling, no edema, no deformity, no laceration and normal pulse.       Back:  Lymphadenopathy:    She has no cervical adenopathy.  Neurological: She is alert and oriented to person, place, and time. She has normal reflexes. She displays no atrophy and no tremor. No cranial nerve deficit or sensory deficit. She exhibits normal muscle tone. Coordination and gait normal.  Skin: She is not diaphoretic.  Nursing note and vitals reviewed.   ED Course  Procedures (including critical care time)  Labs Review Labs Reviewed - No data to display  Imaging Review No results found.   Visual Acuity Review  Right Eye Distance:   Left Eye Distance:   Bilateral Distance:    Right Eye Near:   Left Eye Near:    Bilateral Near:         MDM   1. Neck strain, initial encounter   2. Contusion of head, initial encounter   3. Arm contusion, left, initial encounter   4. Contusion of leg, left, initial encounter    (normal; non-focal neurologic exam)   Discharge Medication List as of 07/21/2015  5:21 PM    START taking these medications     Details  cyclobenzaprine (FLEXERIL) 10 MG tablet Take 1 tablet (10 mg total) by mouth at bedtime., Starting 07/21/2015, Until Discontinued, Print       1. x-ray results and diagnosis reviewed with patient 2. rx as per orders above; reviewed possible side effects, interactions, risks and benefits  3. Recommend supportive treatment with otc analgesics 4. Follow-up prn if symptoms worsen or don't improve     Payton Mccallumrlando Kaylib Furness, MD 08/10/15 1154

## 2017-11-05 ENCOUNTER — Ambulatory Visit
Admission: EM | Admit: 2017-11-05 | Discharge: 2017-11-05 | Disposition: A | Discharge status: Home / Self Care | Payer: BC Managed Care – PPO | Attending: Family Medicine | Admitting: Family Medicine

## 2017-11-05 ENCOUNTER — Encounter: Payer: Self-pay | Admitting: *Deleted

## 2017-11-05 DIAGNOSIS — R110 Nausea: Secondary | ICD-10-CM | POA: Insufficient documentation

## 2017-11-05 DIAGNOSIS — Z823 Family history of stroke: Secondary | ICD-10-CM | POA: Insufficient documentation

## 2017-11-05 DIAGNOSIS — R42 Dizziness and giddiness: Secondary | ICD-10-CM

## 2017-11-05 DIAGNOSIS — H8149 Vertigo of central origin, unspecified ear: Secondary | ICD-10-CM | POA: Diagnosis not present

## 2017-11-05 DIAGNOSIS — Z79899 Other long term (current) drug therapy: Secondary | ICD-10-CM | POA: Insufficient documentation

## 2017-11-05 DIAGNOSIS — Z8249 Family history of ischemic heart disease and other diseases of the circulatory system: Secondary | ICD-10-CM | POA: Diagnosis not present

## 2017-11-05 DIAGNOSIS — Z885 Allergy status to narcotic agent status: Secondary | ICD-10-CM | POA: Diagnosis not present

## 2017-11-05 DIAGNOSIS — Z833 Family history of diabetes mellitus: Secondary | ICD-10-CM | POA: Diagnosis not present

## 2017-11-05 DIAGNOSIS — R51 Headache: Secondary | ICD-10-CM

## 2017-11-05 DIAGNOSIS — G43909 Migraine, unspecified, not intractable, without status migrainosus: Secondary | ICD-10-CM | POA: Diagnosis not present

## 2017-11-05 LAB — GLUCOSE, CAPILLARY: Glucose-Capillary: 125 mg/dL — ABNORMAL HIGH (ref 70–99)

## 2017-11-05 MED ORDER — DIAZEPAM 2 MG PO TABS: 1.0000 mg | ORAL_TABLET | Freq: Two times a day (BID) | ORAL | 0 refills | Status: DC

## 2017-11-05 NOTE — Discharge Instructions (Signed)
Try the maneuver if you can tolerated.  Use the medication as directed.  Take care  Dr. Adriana Simasook

## 2017-11-05 NOTE — ED Provider Notes (Signed)
[facility]    CSN: [id number] Arrival date & time: 11/05/17  1329   History   Chief Complaint Chief Complaint  Patient presents with  . Dizziness  . Headache   HPI  32 year old female presents with headache and dizziness.  Patient reports that she developed dizziness while at lunch today.  She reports that her head feels like it spinning.  Worse with movements.  Associated nausea but no vomiting.  Patient reports photophobia as well.  She states that now she has an ongoing migraine headache which is located behind her left eye.  No medications or interventions tried.  Symptoms are severe.  No relieving factors.  No other associated symptoms.  No other complaints  PMH: Allergic state    Migraine    Frequent UTI     Surgical Hx: None.  Home Medications    Prior to Admission medications   Medication Sig Start Date End Date Taking? Authorizing Provider  cyclobenzaprine (FLEXERIL) 10 MG tablet Take 1 tablet (10 mg total) by mouth at bedtime. 07/21/15   Payton Mccallumonty, [place], MD  diazepam (VALIUM) 2 MG tablet Take 0.5-1 tablets (1-2 mg total) by mouth every 12 (twelve) hours. 11/05/17   Tommie Samsook, Sairah Knobloch G, DO  nortriptyline (PAMELOR) 25 MG capsule Take 25 mg by mouth at bedtime.    [provider]  ondansetron (ZOFRAN-ODT) 4 MG disintegrating tablet Take 1 tablet (4 mg total) by mouth every 8 (eight) hours as needed for nausea or vomiting. 02/22/15   Darci CurrentBrown, Worden N, MD  oxyCODONE-acetaminophen (PERCOCET/ROXICET) 5-325 MG tablet Take 1 tablet by mouth every 4 (four) hours as needed for severe pain. 02/22/15   Darci CurrentBrown, Raritan N, MD  SUMAtriptan (IMITREX) 50 MG tablet Take 50 mg by mouth every 2 (two) hours as needed for migraine. May repeat in 2 hours if headache persists or recurs.    [provider]   Family History Coronary Artery Disease (Blocked arteries around heart) Father    High blood pressure (Hypertension) Father    Stroke Father    Coronary  Artery Disease (Blocked arteries around heart) Maternal Grandfather    High blood pressure (Hypertension) Maternal Grandfather    Diabetes type II Maternal Grandmother    Stroke Maternal Grandmother    Migraines Mother    Diabetes type II Paternal Grandmother     Social History Social History   Tobacco Use  . Smoking status: Never Smoker  . Smokeless tobacco: Never Used  Substance Use Topics  . Alcohol use: No  . Drug use: Not on file   Allergies   Percocet [oxycodone-acetaminophen]   Review of Systems Review of Systems  Eyes: Positive for photophobia.  Neurological: Positive for dizziness.       Headache.   Physical Exam Triage Vital Signs ED Triage Vitals  Enc Vitals Group     BP 11/05/17 1332 (!) 123/97     Pulse Rate 11/05/17 1332 100     Resp 11/05/17 1332 16     Temp 11/05/17 1332 98.3 F (36.8 C)     Temp Source 11/05/17 1332 Oral     SpO2 11/05/17 1332 100 %     Weight 11/05/17 1333 198 lb (89.8 kg)     Height 11/05/17 1333 5\' 3"  (1.6 m)     Head Circumference --      Peak Flow --      Pain Score 11/05/17 1332 3     Pain Loc --      Pain Edu? --  Excl. in GC? --    Updated Vital Signs BP (!) 123/97 (BP Location: Right Arm)   Pulse 100   Temp 98.3 F (36.8 C) (Oral)   Resp 16   Ht 5\' 3"  (1.6 m)   Wt 198 lb (89.8 kg)   SpO2 100%   BMI 35.07 kg/m     Physical Exam  Constitutional: She is oriented to person, place, and time. She appears well-developed.  Covering eyes.   HENT:  Head: Normocephalic and atraumatic.  Mouth/Throat: Oropharynx is clear and moist.  Eyes: Pupils are equal, round, and reactive to light. Conjunctivae are normal.  Cardiovascular: Normal rate and regular rhythm.  Pulmonary/Chest: Effort normal and breath sounds normal. She has no wheezes. She has no rales.  Neurological: She is alert and oriented to person, place, and time.  Severe dizziness with Dix-Hallpike.  Nystagmus noted on the right.  Psychiatric: She  has a normal mood and affect. Her behavior is normal.  Nursing note and vitals reviewed.  UC Treatments / Results  Labs (all labs ordered are listed, but only abnormal results are displayed) Labs Reviewed  GLUCOSE, CAPILLARY - Abnormal; Notable for the following components:      Result Value   Glucose-Capillary 125 (*)    All other components within normal limits  CBG MONITORING, ED    EKG None  Radiology No results found.  Procedures Procedures (including critical care time)  Medications Ordered in UC Medications - No data to display  Initial Impression / Assessment and Plan / UC Course  I have reviewed the triage vital signs and the nursing notes.  Pertinent labs & imaging results that were available during my care of the patient were reviewed by me and considered in my medical decision making (see chart for details).    32 year old female presents with vertigo.  Appears to be peripheral.  Treating with Valium given severe symptoms.  Advised to try Epley maneuver.  Patient is to follow-up with her neurologist.  Patient declined medication for migraine today.  She states that she will use her home medication.  Final Clinical Impressions(s) / UC Diagnoses   Final diagnoses:  Vertigo     Discharge Instructions     Try the maneuver if you can tolerated.  Use the medication as directed.  Take care  Dr. Adriana Simas      ED Prescriptions    Medication Sig Dispense Auth. Provider   diazepam (VALIUM) 2 MG tablet Take 0.5-1 tablets (1-2 mg total) by mouth every 12 (twelve) hours. 6 tablet Tommie Sams, DO     Controlled Substance Prescriptions [place] Controlled Substance Registry consulted? Not Applicable   Tommie Sams, DO 11/05/17 1425

## 2017-11-05 NOTE — ED Triage Notes (Addendum)
Sudden onset dizziness and headache x1 hour ago. States second episode this week. Worse with position changes. Light sensitive. Hx of migraines.

## 2017-11-12 ENCOUNTER — Emergency Department: Payer: BC Managed Care – PPO

## 2017-11-12 ENCOUNTER — Other Ambulatory Visit: Payer: Self-pay

## 2017-11-12 ENCOUNTER — Emergency Department
Admission: EM | Admit: 2017-11-12 | Discharge: 2017-11-12 | Disposition: A | Discharge status: Home / Self Care | Payer: BC Managed Care – PPO | Attending: Emergency Medicine | Admitting: Emergency Medicine

## 2017-11-12 DIAGNOSIS — Z87891 Personal history of nicotine dependence: Secondary | ICD-10-CM | POA: Diagnosis not present

## 2017-11-12 DIAGNOSIS — R0789 Other chest pain: Secondary | ICD-10-CM | POA: Insufficient documentation

## 2017-11-12 DIAGNOSIS — R42 Dizziness and giddiness: Secondary | ICD-10-CM | POA: Diagnosis present

## 2017-11-12 DIAGNOSIS — Z79899 Other long term (current) drug therapy: Secondary | ICD-10-CM | POA: Insufficient documentation

## 2017-11-12 LAB — BASIC METABOLIC PANEL
Anion gap: 9 (ref 5–15)
BUN: 13 mg/dL (ref 6–20)
CO2: 23 mmol/L (ref 22–32)
CREATININE: 0.85 mg/dL (ref 0.44–1.00)
Calcium: 9.5 mg/dL (ref 8.9–10.3)
Chloride: 107 mmol/L (ref 98–111)
GFR calc non Af Amer: >60 mL/min (ref >60)
Glucose, Bld: 110 mg/dL — ABNORMAL HIGH (ref 70–99)
POTASSIUM: 3.9 mmol/L (ref 3.5–5.1)
SODIUM: 139 mmol/L (ref 135–145)

## 2017-11-12 LAB — CBC
HCT: 45.9 % (ref 35.0–47.0)
Hemoglobin: 16.2 g/dL — ABNORMAL HIGH (ref 12.0–16.0)
MCH: 30.9 pg (ref 26.0–34.0)
MCHC: 35.2 g/dL (ref 32.0–36.0)
MCV: 87.8 fL (ref 80.0–100.0)
PLATELETS: 324 10*3/uL (ref 150–440)
RBC: 5.22 MIL/uL — ABNORMAL HIGH (ref 3.80–5.20)
RDW: 13 % (ref 11.5–14.5)
WBC: 10.5 10*3/uL (ref 3.6–11.0)

## 2017-11-12 LAB — POCT PREGNANCY, URINE: Preg Test, Ur: NEGATIVE

## 2017-11-12 LAB — FIBRIN DERIVATIVES D-DIMER (ARMC ONLY): FIBRIN DERIVATIVES D-DIMER (ARMC): 180.11 ng{FEU}/mL (ref 0.00–499.00)

## 2017-11-12 LAB — TROPONIN I: Troponin I: <0.03 ng/mL (ref <0.03)

## 2017-11-12 MED ORDER — ONDANSETRON HCL 4 MG/2ML IJ SOLN
INTRAMUSCULAR | Status: AC
  Administered 2017-11-12: 22:00:00
  Filled 2017-11-12: qty 2

## 2017-11-12 MED ORDER — SODIUM CHLORIDE 0.9 % IV BOLUS
1000.0000 mL | Freq: Once | INTRAVENOUS | Status: AC
  Administered 2017-11-12: 1000 mL via INTRAVENOUS

## 2017-11-12 NOTE — Discharge Instructions (Addendum)
I suspect your tingling, your dizziness, and your other symptoms probably are related to your Maxalt.  I would advise you not to take the Maxalt, please follow closely with your neurologist tomorrow morning.  Return to the emergency room for any new or worrisome symptoms including chest pain shortness of breath, severe headache which is atypical for you, weakness, or any other concerns.  I would suggest that you also follow-up with primary care doctor and I will give you a referral because you had chest pain of uncertain causation at the end of the day to cardiology.

## 2017-11-12 NOTE — ED Triage Notes (Signed)
Patient c/o dizziness X 1 week. Seen at urgent care for same. Diagnosed with vertigo. patient c/o medial chest pain, radiating to left shoulder and back, described as sharp, beginning 1 hour ago.

## 2017-11-12 NOTE — ED Notes (Signed)

## 2017-11-12 NOTE — ED Notes (Signed)
IV removed that was placed, skin is dry, clean and intact

## 2017-11-12 NOTE — ED Provider Notes (Addendum)
[facility] Emergency Department Provider Note  ____________________________________________   I have reviewed the triage vital signs and the nursing notes. Where available I have reviewed prior notes and, if possible and indicated, outside hospital notes.    HISTORY  Chief Complaint Chest Pain and Dizziness    HPI Tiffany Cuevas is a 32 y.o. female has a history of daily migraine for years, on nortriptyline, Topamax, and Maxalt.  She is most recently placed on Maxalt a few months ago.  She takes the Maxalt nearly every day, for 5 times in the last week alone.  This is for her chronic headaches.  She has been seen and evaluated by neurology and is closely followed by neurologist in fact has a appointment with neurology tomorrow.  Patient states that a week ago she went to urgent care with a sensation of dizziness, they gave her Valium, and her work-up was otherwise on remarkable and she was sent home.  She states that the vertigo symptoms have resolved, but she has, in the place, multiple other complaints, she feels flushed, she has had some nonbloody non-melanotic diarrhea, she has had diffuse tingling, significant anxiety, and she had a brief period of chest discomfort today.  All of the symptoms were worse after taking her Maxalt today and it seems to be in some ways linked to her Maxalt taking.  She does not have any anaphylactic symptoms such as hives or shortness of breath.  She states that the pain was a tightness they can seem to go to her shoulder.  Nonexertional and is gone now.  She has had nausea but no vomiting.  She is not intentionally taking overdoses of any of these medications.  Take them as prescribed she is taking the Maxalt, probably too much".  Her husband expresses concern that possibly this is a medication reaction and she agrees.  When I do mention the patient's heart rate upon entry which almost immediately corrected, she states "I was really really  scared and anxious."  Past Medical History:  Diagnosis Date  . Migraines     There are no active problems to display for this patient.   Past Surgical History:  Procedure Laterality Date  . OVARIAN CYST REMOVAL    . WISDOM TOOTH EXTRACTION      Prior to Admission medications   Medication Sig Start Date End Date Taking? Authorizing Provider  nortriptyline (PAMELOR) 10 MG capsule Take 40 mg by mouth at bedtime.    Yes [provider]  topiramate (TOPAMAX) 100 MG tablet Take 100 mg by mouth daily.  09/25/17  Yes [provider]  cyclobenzaprine (FLEXERIL) 10 MG tablet Take 1 tablet (10 mg total) by mouth at bedtime. Patient not taking: Reported on 11/12/2017 07/21/15   Payton Mccallum, MD  diazepam (VALIUM) 2 MG tablet Take 0.5-1 tablets (1-2 mg total) by mouth every 12 (twelve) hours. Patient not taking: Reported on 11/12/2017 11/05/17   Tommie Sams, DO  ondansetron (ZOFRAN-ODT) 4 MG disintegrating tablet Take 1 tablet (4 mg total) by mouth every 8 (eight) hours as needed for nausea or vomiting. Patient not taking: Reported on 11/12/2017 02/22/15   Darci Current, MD  oxyCODONE-acetaminophen (PERCOCET/ROXICET) 5-325 MG tablet Take 1 tablet by mouth every 4 (four) hours as needed for severe pain. Patient not taking: Reported on 11/12/2017 02/22/15   Darci Current, MD  rizatriptan (MAXALT-MLT) 10 MG disintegrating tablet Take 1 tablet by mouth every 2 (two) hours as needed. 10/31/17  [provider]  SUMAtriptan (IMITREX) 100 MG tablet Take 100 mg by mouth every 2 (two) hours as needed for migraine. May repeat in 2 hours if headache persists or recurs.     [provider]    Allergies Amoxicillin and Percocet [oxycodone-acetaminophen]  Family History  Problem Relation Age of Onset  . Healthy Mother   . Healthy Father     Social History Social History   Tobacco Use  . Smoking status: Former Games developer  . Smokeless tobacco: Never Used  Substance  Use Topics  . Alcohol use: No  . Drug use: Not on file    Review of Systems Constitutional: No fever/chills Eyes: No visual changes. ENT: No sore throat. No stiff neck no neck pain Cardiovascular: See HPI Respiratory: Denies shortness of breath. Gastrointestinal:   no vomiting.  + diarrhea.  No constipation. Genitourinary: Negative for dysuria. Musculoskeletal: Negative lower extremity swelling Skin: Negative for rash. Neurological: Negative for severe headaches, focal weakness or numbness.   ____________________________________________   PHYSICAL EXAM:  VITAL SIGNS: ED Triage Vitals  Enc Vitals Group     BP 11/12/17 1939 (!) 143/106     Pulse Rate 11/12/17 1939 (!) 153     Resp 11/12/17 1939 16     Temp 11/12/17 1939 98.8 F (37.1 C)     Temp Source 11/12/17 1939 Oral     SpO2 11/12/17 1939 100 %     Weight 11/12/17 1944 198 lb (89.8 kg)     Height 11/12/17 1944 5\' 3"  (1.6 m)     Head Circumference --      Peak Flow --      Pain Score 11/12/17 1943 5     Pain Loc --      Pain Edu? --      Excl. in GC? --     Constitutional: Alert and oriented. Well appearing and in no acute distress.  Patient is anxious. Eyes: Conjunctivae are normal, there is no ocular clonus Head: Atraumatic HEENT: No congestion/rhinnorhea. Mucous membranes are moist.  Oropharynx non-erythematous Neck:   Nontender with no meningismus, no masses, no stridor Cardiovascular: Normal rate, regular rhythm. Grossly normal heart sounds.  Good peripheral circulation.  His 80s to 90s Respiratory: Normal respiratory effort.  No retractions. Lungs CTAB. Abdominal: Soft and nontender. No distention. No guarding no rebound Back:  There is no focal tenderness or step off.  there is no midline tenderness there are no lesions noted. there is no CVA tenderness Musculoskeletal: No lower extremity tenderness, no upper extremity tenderness. No joint effusions, no DVT signs strong distal pulses no edema there is no  clonus reflexes are symmetric and normal Neurologic: Cranial nerves II through XII are grossly intact 5 out of 5 strength bilateral upper and lower extremity. Finger to nose within normal limits heel to shin within normal limits, speech is normal with no word finding difficulty or dysarthria, reflexes symmetric, pupils are equally round and reactive to light, there is no pronator drift, sensation is normal, vision is intact to confrontation, gait is deferred, there is no nystagmus, normal neurologic exam Skin:  Skin is warm, dry and intact. No rash noted. Psychiatric: Mood and affect are very anxious. Speech and behavior are normal.  ____________________________________________   LABS (all labs ordered are listed, but only abnormal results are displayed)  Labs Reviewed  BASIC METABOLIC PANEL - Abnormal; Notable for the following components:      Result Value   Glucose, Bld 110 (*)  All other components within normal limits  CBC - Abnormal; Notable for the following components:   RBC 5.22 (*)    Hemoglobin 16.2 (*)    All other components within normal limits  TROPONIN I  FIBRIN DERIVATIVES D-DIMER ([facility] ONLY)  POC URINE PREG, ED  POCT PREGNANCY, URINE    Pertinent labs  results that were available during my care of the patient were reviewed by me and considered in my medical decision making (see chart for details). ____________________________________________  EKG  I personally interpreted any EKGs ordered by me or triage Sinus tach rate 118, no acute ST elevation or depression, normal axis, QRS is 80, QTc 445 ____________________________________________  RADIOLOGY  Pertinent labs & imaging results that were available during my care of the patient were reviewed by me and considered in my medical decision making (see chart for details). If possible, patient and/or family made aware of any abnormal findings.  Dg Chest 2 View  Result Date: 11/12/2017 CLINICAL DATA:  Pain EXAM:  CHEST - 2 VIEW COMPARISON:  None. FINDINGS: The heart size and mediastinal contours are within normal limits. Both lungs are clear. Minimal scoliosis of the spine. IMPRESSION: No active cardiopulmonary disease. Electronically Signed   By: Jasmine Pang M.D.   On: 11/12/2017 20:47   ____________________________________________    PROCEDURES  Procedure(s) performed: None  Procedures  Critical Care performed: None  ____________________________________________   INITIAL IMPRESSION / ASSESSMENT AND PLAN / ED COURSE  Pertinent labs & imaging results that were available during my care of the patient were reviewed by me and considered in my medical decision making (see chart for details).  Patient here with multiple different symptoms of flushing, tingling, atypical chest discomfort which is now gone, anxiety, diarrhea, nausea, all of this seems to increase with the more frequency so that she takes the Maxalt, and all of them are side effects of Maxalt.  Is a course impossible to prove the Maxalt is directly related to these things but it does tend to reason that there is likely a connection.  Patient's heart rate was very elevated she really does not have any risk factors for PE however, I will send a d-dimer for her atypical chest pain.  Her work-up is thus far otherwise unremarkable.  Heart rate was very elevated when she came in but she was very very anxious and almost immediately corrected is like reassured her.  There is no evidence of serotonin syndrome, she does not meet any other criteria really and I talked to the [facility] about her and they agree.  However, I do think the patient is having side effects from excess Maxalt ingestion relative to her tolerance.  Patient had Maxalt a few hours before all the symptoms started and Maxalt is notorious for causing the symptoms.  I would advise her to not take the Maxalt until she can see her primary care doctor tomorrow and neurologist,  and we will continue to evaluate her.  At this time, there does not appear to be clinical evidence to support the diagnosis of pulmonary embolus, dissection, myocarditis, endocarditis, pericarditis, pericardial tamponade, acute coronary syndrome, pneumothorax, pneumonia, or any other acute intrathoracic pathology that will require admission or acute intervention. Nor is there evidence of any significant intra-abdominal pathology causing this discomfort.  ----------------------------------------- 11:24 PM on 11/12/2017 -----------------------------------------  Patient is in no acute distress, when I am in the room her heart rate is 105 I leave the room rapidly goes down to 80, and  as I talked her gradually comes down as well.  Patient admits to being quite anxious.  And I think this is certainly consistent with a.  D-dimer is negative, I do not think she needs a CT scan.  Chest pain completely resolved prior to coming in and its constellation of symptoms that include chest pain all of which could be attributed when I think should probably be attributed to Maxalt.  Patient states her symptoms actually started a couple months ago and have been persistent since she started the Maxalt.  We will therefore discharge her, I think that this could be related to some very mild serotonin toxicity I have advised her not to take Maxalt until she sees her neurologist tomorrow morning in the morning at 815 and she already has an appointment.  She is very comfortable with this plan we talked about admission but I do not see any clear indication and she is adamant that she wants to go home anyway.  Return precautions and follow-up given and understood.  Patient will come back if she feels worse in any way.    ____________________________________________   FINAL CLINICAL IMPRESSION(S) / ED DIAGNOSES  Final diagnoses:  None      This chart was dictated using voice recognition software.  Despite best efforts to  proofread,  errors can occur which can change meaning.      Jeanmarie PlantMcShane, Karcyn Menn A, MD 11/12/17 2127    Jeanmarie PlantMcShane, Jaydence Vanyo A, MD 11/12/17 [phone]

## 2017-11-12 NOTE — ED Notes (Signed)
Pt in with co dizziness since last Thursday was dx with vertigo by pmd and given valium without relief. Pt co nausea, pt states today started having left sided cp with radiation to left arm. Pt co nausea no vomiting, also co headache hx of migraines. Pt has no cardiac history, has had some shob none noted at this time, denies any other recent cold symptoms or cough, no fever.

## 2017-11-12 NOTE — ED Notes (Signed)
EKG signed by Dr. Mayford KnifeWilliams

## 2017-11-16 ENCOUNTER — Other Ambulatory Visit: Payer: Self-pay

## 2017-11-16 ENCOUNTER — Ambulatory Visit
Admission: EM | Admit: 2017-11-16 | Discharge: 2017-11-16 | Disposition: A | Discharge status: Home / Self Care | Payer: BC Managed Care – PPO | Attending: Family Medicine | Admitting: Family Medicine

## 2017-11-16 ENCOUNTER — Encounter: Payer: Self-pay | Admitting: Emergency Medicine

## 2017-11-16 DIAGNOSIS — F419 Anxiety disorder, unspecified: Secondary | ICD-10-CM

## 2017-11-16 DIAGNOSIS — F410 Panic disorder [episodic paroxysmal anxiety] without agoraphobia: Secondary | ICD-10-CM | POA: Diagnosis not present

## 2017-11-16 DIAGNOSIS — F411 Generalized anxiety disorder: Secondary | ICD-10-CM

## 2017-11-16 LAB — TSH: TSH: 1.391 u[IU]/mL (ref 0.350–4.500)

## 2017-11-16 MED ORDER — HYDROXYZINE HCL 10 MG PO TABS: 10.0000 mg | ORAL_TABLET | Freq: Three times a day (TID) | ORAL | 0 refills | Status: AC | PRN

## 2017-11-16 NOTE — ED Triage Notes (Signed)
Patient states that she was seen at [facility] ED last Thursday and was diagnosed with Maxalt overdose.  Patient states that she was taking the medicine too often for a long period of time.  Patient states that since stopping the Maxalt she feels anxious and is having panic attacks.

## 2017-11-16 NOTE — ED Provider Notes (Signed)
[facility]    CSN: [id number] Arrival date & time: 11/16/17  1558     History   Chief Complaint Chief Complaint  Patient presents with  . Anxiety    HPI Tiffany Cuevas is a 32 y.o. female.   32 yo female with a c/o anxiety and panic attacks for the past week. States she recently stopped maxalt 5 days ago after having taken it for years. Patient was seen in ED last Thursday (5 days ago). States she has felt more anxious since stopping her maxalt. Has a follow up with her neurologist scheduled.   The history is provided by the patient.    Past Medical History:  Diagnosis Date  . Migraines     There are no active problems to display for this patient.   Past Surgical History:  Procedure Laterality Date  . OVARIAN CYST REMOVAL    . WISDOM TOOTH EXTRACTION      OB History   None      Home Medications    Prior to Admission medications   Medication Sig Start Date End Date Taking? Authorizing Provider  diazepam (VALIUM) 2 MG tablet Take 0.5-1 tablets (1-2 mg total) by mouth every 12 (twelve) hours. 11/05/17  Yes Cook, Jayce G, DO  nortriptyline (PAMELOR) 10 MG capsule Take 40 mg by mouth at bedtime.    Yes [provider]  topiramate (TOPAMAX) 100 MG tablet Take 100 mg by mouth daily.  09/25/17  Yes [provider]  cyclobenzaprine (FLEXERIL) 10 MG tablet Take 1 tablet (10 mg total) by mouth at bedtime. Patient not taking: Reported on 11/12/2017 07/21/15   Payton Mccallumonty, Druanne Bosques, MD  hydrOXYzine (ATARAX/VISTARIL) 10 MG tablet Take 1 tablet (10 mg total) by mouth every 8 (eight) hours as needed for anxiety. 11/16/17   Payton Mccallumonty, Albertia Carvin, MD  ondansetron (ZOFRAN-ODT) 4 MG disintegrating tablet Take 1 tablet (4 mg total) by mouth every 8 (eight) hours as needed for nausea or vomiting. Patient not taking: Reported on 11/12/2017 02/22/15   Darci CurrentBrown, [place] N, MD  oxyCODONE-acetaminophen (PERCOCET/ROXICET) 5-325 MG tablet Take 1 tablet by mouth every 4 (four) hours as  needed for severe pain. Patient not taking: Reported on 11/12/2017 02/22/15   Darci CurrentBrown, [place] N, MD  rizatriptan (MAXALT-MLT) 10 MG disintegrating tablet Take 1 tablet by mouth every 2 (two) hours as needed. 10/31/17   [provider]  SUMAtriptan (IMITREX) 100 MG tablet Take 100 mg by mouth every 2 (two) hours as needed for migraine. May repeat in 2 hours if headache persists or recurs.     [provider]    Family History Family History  Problem Relation Age of Onset  . Healthy Mother   . Healthy Father     Social History Social History   Tobacco Use  . Smoking status: Former Games developermoker  . Smokeless tobacco: Never Used  Substance Use Topics  . Alcohol use: No  . Drug use: Never     Allergies   Amoxicillin and Percocet [oxycodone-acetaminophen]   Review of Systems Review of Systems   Physical Exam Triage Vital Signs ED Triage Vitals  Enc Vitals Group     BP 11/16/17 1619 129/90     Pulse Rate 11/16/17 1619 (!) 110     Resp 11/16/17 1619 16     Temp 11/16/17 1619 98 F (36.7 C)     Temp Source 11/16/17 1619 Oral     SpO2 11/16/17 1619 100 %     Weight 11/16/17  1616 198 lb (89.8 kg)     Cuevas 11/16/17 1616 5\' 3"  (1.6 m)     Head Circumference --      Peak Flow --      Pain Score 11/16/17 1616 0     Pain Loc --      Pain Edu? --      Excl. in GC? --    No data found.  Updated Vital Signs BP 129/90 (BP Location: Left Arm)   Pulse (!) 110   Temp 98 F (36.7 C) (Oral)   Resp 16   Ht 5\' 3"  (1.6 m)   Wt 198 lb (89.8 kg)   SpO2 100%   BMI 35.07 kg/m   Visual Acuity Right Eye Distance:   Left Eye Distance:   Bilateral Distance:    Right Eye Near:   Left Eye Near:    Bilateral Near:     Physical Exam  Constitutional: She appears well-developed and well-nourished. No distress.  Neck: Neck supple. No thyromegaly present.  Cardiovascular: Regular rhythm, normal heart sounds and intact distal pulses.  Pulmonary/Chest: Effort normal and  breath sounds normal. No stridor. No respiratory distress. She has no wheezes. She has no rales.  Skin: She is not diaphoretic.  Psychiatric: Her behavior is normal. Judgment and thought content normal. Her mood appears anxious.  Nursing note and vitals reviewed.    UC Treatments / Results  Labs (all labs ordered are listed, but only abnormal results are displayed) Labs Reviewed  TSH    EKG None  Radiology No results found.  Procedures Procedures (including critical care time)  Medications Ordered in UC Medications - No data to display  Initial Impression / Assessment and Plan / UC Course  I have reviewed the triage vital signs and the nursing notes.  Pertinent labs & imaging results that were available during my care of the patient were reviewed by me and considered in my medical decision making (see chart for details).      Final Clinical Impressions(s) / UC Diagnoses   Final diagnoses:  Anxiety   Discharge Instructions   None    ED Prescriptions    Medication Sig Dispense Auth. Provider   hydrOXYzine (ATARAX/VISTARIL) 10 MG tablet Take 1 tablet (10 mg total) by mouth every 8 (eight) hours as needed for anxiety. 10 tablet Payton Mccallum, MD       1. diagnosis reviewed with patient 2. rx as per orders above; reviewed possible side effects, interactions, risks and benefits  3. Check TSH 4. Recommend establish care with PCP 5. Follow-up prn if symptoms worsen or don't improve   Controlled Substance Prescriptions [place] Controlled Substance Registry consulted? Not Applicable   Payton Mccallum, MD 11/16/17 [phone]

## 2017-11-17 ENCOUNTER — Encounter: Payer: Self-pay | Admitting: Emergency Medicine

## 2017-11-17 ENCOUNTER — Emergency Department
Admission: EM | Admit: 2017-11-17 | Discharge: 2017-11-17 | Disposition: A | Discharge status: Home / Self Care | Payer: BC Managed Care – PPO | Attending: Emergency Medicine | Admitting: Emergency Medicine

## 2017-11-17 ENCOUNTER — Emergency Department: Payer: BC Managed Care – PPO

## 2017-11-17 DIAGNOSIS — Z87891 Personal history of nicotine dependence: Secondary | ICD-10-CM | POA: Diagnosis not present

## 2017-11-17 DIAGNOSIS — F419 Anxiety disorder, unspecified: Secondary | ICD-10-CM | POA: Diagnosis not present

## 2017-11-17 DIAGNOSIS — Z79899 Other long term (current) drug therapy: Secondary | ICD-10-CM | POA: Insufficient documentation

## 2017-11-17 DIAGNOSIS — R109 Unspecified abdominal pain: Secondary | ICD-10-CM | POA: Diagnosis not present

## 2017-11-17 DIAGNOSIS — R110 Nausea: Secondary | ICD-10-CM | POA: Diagnosis not present

## 2017-11-17 DIAGNOSIS — R55 Syncope and collapse: Secondary | ICD-10-CM | POA: Insufficient documentation

## 2017-11-17 DIAGNOSIS — N390 Urinary tract infection, site not specified: Secondary | ICD-10-CM | POA: Insufficient documentation

## 2017-11-17 DIAGNOSIS — R079 Chest pain, unspecified: Secondary | ICD-10-CM | POA: Diagnosis not present

## 2017-11-17 LAB — BASIC METABOLIC PANEL
ANION GAP: 10 (ref 5–15)
BUN: 10 mg/dL (ref 6–20)
CHLORIDE: 106 mmol/L (ref 98–111)
CO2: 22 mmol/L (ref 22–32)
Calcium: 9.8 mg/dL (ref 8.9–10.3)
Creatinine, Ser: 0.82 mg/dL (ref 0.44–1.00)
GFR calc non Af Amer: >60 mL/min (ref >60)
Glucose, Bld: 116 mg/dL — ABNORMAL HIGH (ref 70–99)
POTASSIUM: 3.6 mmol/L (ref 3.5–5.1)
Sodium: 138 mmol/L (ref 135–145)

## 2017-11-17 LAB — URINALYSIS, COMPLETE (UACMP) WITH MICROSCOPIC
BILIRUBIN URINE: NEGATIVE
GLUCOSE, UA: NEGATIVE mg/dL
HGB URINE DIPSTICK: NEGATIVE
Ketones, ur: NEGATIVE mg/dL
Nitrite: POSITIVE — AB
PROTEIN: NEGATIVE mg/dL
SPECIFIC GRAVITY, URINE: 1.006 (ref 1.005–1.030)
pH: 7 (ref 5.0–8.0)

## 2017-11-17 LAB — HEPATIC FUNCTION PANEL
ALT: 34 U/L (ref 0–44)
AST: 24 U/L (ref 15–41)
Albumin: 3.9 g/dL (ref 3.5–5.0)
Alkaline Phosphatase: 58 U/L (ref 38–126)
Bilirubin, Direct: <0.1 mg/dL (ref 0.0–0.2)
Total Bilirubin: 0.5 mg/dL (ref 0.3–1.2)
Total Protein: 6.4 g/dL — ABNORMAL LOW (ref 6.5–8.1)

## 2017-11-17 LAB — URINE DRUG SCREEN, QUALITATIVE (ARMC ONLY)
Amphetamines, Ur Screen: NOT DETECTED
Barbiturates, Ur Screen: NOT DETECTED
CANNABINOID 50 NG, UR ~~LOC~~: NOT DETECTED
Cocaine Metabolite,Ur ~~LOC~~: NOT DETECTED
MDMA (ECSTASY) UR SCREEN: NOT DETECTED
Methadone Scn, Ur: NOT DETECTED
OPIATE, UR SCREEN: NOT DETECTED
Phencyclidine (PCP) Ur S: NOT DETECTED
Tricyclic, Ur Screen: POSITIVE — AB

## 2017-11-17 LAB — CBC
HCT: 46.7 % (ref 35.0–47.0)
HEMOGLOBIN: 16.3 g/dL — AB (ref 12.0–16.0)
MCH: 30.3 pg (ref 26.0–34.0)
MCHC: 34.8 g/dL (ref 32.0–36.0)
MCV: 87 fL (ref 80.0–100.0)
Platelets: 356 10*3/uL (ref 150–440)
RBC: 5.37 MIL/uL — AB (ref 3.80–5.20)
RDW: 13.2 % (ref 11.5–14.5)
WBC: 12.2 10*3/uL — AB (ref 3.6–11.0)

## 2017-11-17 LAB — TSH: TSH: 2.466 u[IU]/mL (ref 0.350–4.500)

## 2017-11-17 LAB — LIPASE, BLOOD: Lipase: 43 U/L (ref 11–51)

## 2017-11-17 LAB — TROPONIN I

## 2017-11-17 LAB — T4, FREE: Free T4: 0.9 ng/dL (ref 0.82–1.77)

## 2017-11-17 LAB — POCT PREGNANCY, URINE: PREG TEST UR: NEGATIVE

## 2017-11-17 MED ORDER — DIAZEPAM 5 MG PO TABS: 5.0000 mg | ORAL_TABLET | Freq: Three times a day (TID) | ORAL | 0 refills | Status: DC | PRN

## 2017-11-17 MED ORDER — ONDANSETRON 4 MG PO TBDP: 4.0000 mg | ORAL_TABLET | Freq: Three times a day (TID) | ORAL | 0 refills | Status: DC | PRN

## 2017-11-17 MED ORDER — ONDANSETRON HCL 4 MG/2ML IJ SOLN
INTRAMUSCULAR | Status: AC
  Filled 2017-11-17: qty 2

## 2017-11-17 MED ORDER — CIPROFLOXACIN IN D5W 400 MG/200ML IV SOLN
400.0000 mg | Freq: Once | INTRAVENOUS | Status: AC
  Administered 2017-11-17: 400 mg via INTRAVENOUS
  Filled 2017-11-17: qty 200

## 2017-11-17 MED ORDER — LORAZEPAM 2 MG/ML IJ SOLN
1.0000 mg | Freq: Once | INTRAMUSCULAR | Status: AC
  Administered 2017-11-17: 1 mg via INTRAVENOUS
  Filled 2017-11-17: qty 1

## 2017-11-17 MED ORDER — CIPROFLOXACIN HCL 500 MG PO TABS: 500.0000 mg | ORAL_TABLET | Freq: Two times a day (BID) | ORAL | 0 refills | Status: DC

## 2017-11-17 MED ORDER — DEXTROSE-NACL 5-0.45 % IV SOLN
INTRAVENOUS | Status: DC
  Administered 2017-11-17: 08:00:00 via INTRAVENOUS

## 2017-11-17 MED ORDER — IOPAMIDOL (ISOVUE-300) INJECTION 61%
30.0000 mL | Freq: Once | INTRAVENOUS | Status: AC
Start: 2017-11-17 — End: 2017-11-17
  Administered 2017-11-17: 30 mL via ORAL

## 2017-11-17 MED ORDER — IOHEXOL 350 MG/ML SOLN
100.0000 mL | Freq: Once | INTRAVENOUS | Status: AC | PRN
  Administered 2017-11-17: 100 mL via INTRAVENOUS

## 2017-11-17 MED ORDER — ONDANSETRON HCL 4 MG/2ML IJ SOLN
4.0000 mg | Freq: Once | INTRAMUSCULAR | Status: AC
  Administered 2017-11-17: 4 mg via INTRAVENOUS

## 2017-11-17 NOTE — ED Notes (Signed)
This RN to bedside at this time, introduced self to patient and husband. Pt being assisted to the bathroom by her husband at this time. Pt states "I'm finally not crying from the CT thing or whatever". Pt appears calm and cooperative with this RN. Medications administered per MD order.

## 2017-11-17 NOTE — Discharge Instructions (Signed)
1.  Take antibiotic as prescribed (Cipro 500 mg twice daily x7 days). 2.  You may take Zofran as needed for nausea. 3.  You may take Valium as needed for anxiety. 4.  Return to the ER for worsening symptoms, persistent vomiting, difficulty breathing or other concerns.

## 2017-11-17 NOTE — ED Notes (Signed)
NAD noted at time of D/C. Pt denies questions or concerns. Pt taken to lobby via wheelchair, D/C into the care of her mother at this time.

## 2017-11-17 NOTE — ED Notes (Signed)
Pt unhooked and assisted to the bathroom by her husband.

## 2017-11-17 NOTE — ED Notes (Signed)
Pt to CT

## 2017-11-17 NOTE — ED Notes (Signed)
Pt returned from CT °

## 2017-11-17 NOTE — ED Triage Notes (Signed)
Pt arrived EMS from home where pt had a near syncopal episode while lying on bathroom floor. Pt was seen for same issue last Thursday due to pt being taken off of Maxalt. Pt is A&O x4, speaking in complete sentences and laughing with this RN.

## 2017-11-17 NOTE — ED Provider Notes (Signed)
[facility] Emergency Department Provider Note   ____________________________________________   First MD Initiated Contact with Patient 11/17/17 [phone]     (approximate)  I have reviewed the triage vital signs and the nursing notes.   HISTORY  Chief Complaint Near Syncope    HPI Tiffany Cuevas is a 32 y.o. female who presents to the ED from home with a myriad of complaints.  Most recently, patient arrives to the ED via EMS from home where she had a near syncopal episode.  Since 7/25, patient had been feeling dizzy.  Prescribed Valium at urgent care.  Also with migraine headaches, heightened anxiety, chest tightness, shortness of breath, abdominal pain, nausea.  Has been seen in the ED on 8/1 and again with her PCP yesterday.  Missed her neurology appointment in the interim which is rescheduled for the 13th of this month.  Patient feels all her symptoms were due to her neurologist starting [place].  She has been off of this now for a week.  Thought she was feeling better but tonight she experienced all the above symptoms of which she is most concerned regarding the anxiety/panic attack.  She felt like she was going to pass out so laid herself down on the bathroom floor and called her husband and mother.  Denies associated fever, chills, vision changes, neck pain, vomiting, dysuria, diarrhea.  Denies recent travel or trauma.  Has an IUD in place.   Past Medical History:  Diagnosis Date  . Migraines     There are no active problems to display for this patient.   Past Surgical History:  Procedure Laterality Date  . OVARIAN CYST REMOVAL    . WISDOM TOOTH EXTRACTION      Prior to Admission medications   Medication Sig Start Date End Date Taking? Authorizing Provider  ciprofloxacin (CIPRO) 500 MG tablet Take 1 tablet (500 mg total) by mouth 2 (two) times daily. 11/17/17   Paulette Blanch, MD  cyclobenzaprine (FLEXERIL) 10 MG tablet Take 1 tablet (10 mg total) by  mouth at bedtime. Patient not taking: Reported on 11/12/2017 07/21/15   Norval Gable, MD  diazepam (VALIUM) 5 MG tablet Take 1 tablet (5 mg total) by mouth every 8 (eight) hours as needed for anxiety. 11/17/17   Paulette Blanch, MD  hydrOXYzine (ATARAX/VISTARIL) 10 MG tablet Take 1 tablet (10 mg total) by mouth every 8 (eight) hours as needed for anxiety. 11/16/17   Norval Gable, MD  nortriptyline (PAMELOR) 10 MG capsule Take 40 mg by mouth at bedtime.     [provider]  ondansetron (ZOFRAN ODT) 4 MG disintegrating tablet Take 1 tablet (4 mg total) by mouth every 8 (eight) hours as needed for nausea or vomiting. 11/17/17   Paulette Blanch, MD  oxyCODONE-acetaminophen (PERCOCET/ROXICET) 5-325 MG tablet Take 1 tablet by mouth every 4 (four) hours as needed for severe pain. Patient not taking: Reported on 11/12/2017 02/22/15   Gregor Hams, MD  rizatriptan (MAXALT-MLT) 10 MG disintegrating tablet Take 1 tablet by mouth every 2 (two) hours as needed. 10/31/17   [provider]  SUMAtriptan (IMITREX) 100 MG tablet Take 100 mg by mouth every 2 (two) hours as needed for migraine. May repeat in 2 hours if headache persists or recurs.     [provider]  topiramate (TOPAMAX) 100 MG tablet Take 100 mg by mouth daily.  09/25/17   [provider]    Allergies Amoxicillin and Percocet [oxycodone-acetaminophen]  Family History  Problem Relation Age of Onset  . Healthy Mother   . Healthy Father     Social History Social History   Tobacco Use  . Smoking status: Former Research scientist (life sciences)  . Smokeless tobacco: Never Used  Substance Use Topics  . Alcohol use: No  . Drug use: Never    Review of Systems  Constitutional: No fever/chills Eyes: No visual changes. ENT: No sore throat. Cardiovascular: Positive for chest pain. Respiratory: Positive for shortness of breath. Gastrointestinal: As noted for abdominal pain and nausea, no vomiting.  No diarrhea.  No  constipation. Genitourinary: Negative for dysuria. Musculoskeletal: Negative for back pain. Skin: Negative for rash. Neurological: Positive for headaches.  Negative for focal weakness or numbness. Psychiatric:Positive for anxiety.  ____________________________________________   PHYSICAL EXAM:  VITAL SIGNS: ED Triage Vitals  Enc Vitals Group     BP 11/17/17 0132 138/87     Pulse Rate 11/17/17 0132 (!) 114     Resp 11/17/17 0132 17     Temp 11/17/17 0132 97.9 F (36.6 C)     Temp Source 11/17/17 0132 Oral     SpO2 11/17/17 0132 100 %     Weight 11/17/17 0133 198 lb (89.8 kg)     Height --      Head Circumference --      Peak Flow --      Pain Score --      Pain Loc --      Pain Edu? --      Excl. in [place]? --     Constitutional: Alert and oriented.  Anxious appearing and in mild acute distress. Eyes: Conjunctivae are normal. PERRL. EOMI. Head: Atraumatic. Nose: No congestion/rhinnorhea. Mouth/Throat: Mucous membranes are moist.  Oropharynx non-erythematous. Neck: No stridor.  No cervical spine tenderness to palpation.  No carotid bruits.  Supple neck without meningismus. Cardiovascular: Normal rate, regular rhythm. Grossly normal heart sounds.  Good peripheral circulation. Respiratory: Normal respiratory effort.  No retractions. Lungs CTAB. Gastrointestinal: Soft and nontender to light or deep palpation. No distention. No abdominal bruits. No CVA tenderness. Musculoskeletal: No lower extremity tenderness nor edema.  No joint effusions. Neurologic: Alert and oriented x3.  CN II-XII grossly intact.  Normal speech and language. No gross focal neurologic deficits are appreciated. No gait instability. Skin:  Skin is warm, dry and intact. No rash noted. Psychiatric: Mood and affect are anxious. Speech and behavior are normal.  ____________________________________________   LABS (all labs ordered are listed, but only abnormal results are displayed)  Labs Reviewed  BASIC  METABOLIC PANEL - Abnormal; Notable for the following components:      Result Value   Glucose, Bld 116 (*)    All other components within normal limits  CBC - Abnormal; Notable for the following components:   WBC 12.2 (*)    RBC 5.37 (*)    Hemoglobin 16.3 (*)    All other components within normal limits  URINALYSIS, COMPLETE (UACMP) WITH MICROSCOPIC - Abnormal; Notable for the following components:   Color, Urine AMBER (*)    APPearance CLOUDY (*)    Nitrite POSITIVE (*)    Leukocytes, UA TRACE (*)    Bacteria, UA MANY (*)    Non Squamous Epithelial PRESENT (*)    All other components within normal limits  URINE DRUG SCREEN, QUALITATIVE ([facility] ONLY) - Abnormal; Notable for the following components:   Tricyclic, Ur Screen POSITIVE (*)    Benzodiazepine, Ur Scrn TEST NOT PERFORMED, REAGENT NOT AVAILABLE (*)  All other components within normal limits  HEPATIC FUNCTION PANEL - Abnormal; Notable for the following components:   Total Protein 6.4 (*)    All other components within normal limits  TROPONIN I  TSH  T4, FREE  LIPASE, BLOOD  POC URINE PREG, ED  POCT PREGNANCY, URINE  CBG MONITORING, ED   ____________________________________________  EKG  ED ECG REPORT I, Burney Calzadilla J, the attending physician, personally viewed and interpreted this ECG.   Date: 11/17/2017  EKG Time: 0133  Rate: 114  Rhythm: sinus tachycardia  Axis: RAD  Intervals:none  ST&T Change: Nonspecific  ____________________________________________  RADIOLOGY  ED MD interpretation:  Pending  Official radiology report(s): No results found.  ____________________________________________   PROCEDURES  Procedure(s) performed: None  Procedures  Critical Care performed: No  ____________________________________________   INITIAL IMPRESSION / ASSESSMENT AND PLAN / ED COURSE  As part of my medical decision making, I reviewed the following data within the [place] History  obtained from family, Nursing notes reviewed and incorporated, Labs reviewed, EKG interpreted, Old chart reviewed, Radiograph reviewed and Notes from prior ED visits   32 year old female with multiple medical complaints, acutely with near syncopal episode, anxiety, chest pain, shortness of breath, abdominal pain and nausea.  Differential diagnosis includes but is not limited to [place], CVA, CAD, PE, aortic dissection, gastritis, gastroenteritis, psychosomatic etiology, etc.    Clinical Course as of Nov 17 709  Tue Nov 17, 2017  0542 Noted orthostatics and urinalysis.  Will order IV Cipro for UTI.  Awaiting CT scans.   [JS]  [id number] Patient currently in CT scan. Anticipate discharge home if CT results unremarkable. Would discharge home on Cipro for UTI; Valium for anxiolysis. Patient to follow up with her neurologist and PCP closely. Care transferred to Dr. Corky Downs pending results of CT scans and disposition.   [JS]    Clinical Course User Index [JS] Paulette Blanch, MD     ____________________________________________   FINAL CLINICAL IMPRESSION(S) / ED DIAGNOSES  Final diagnoses:  Near syncope  Urinary tract infection without hematuria, site unspecified  Anxiety  Nausea  Chest pain, unspecified type  Abdominal pain, unspecified abdominal location     ED Discharge Orders        Ordered    ciprofloxacin (CIPRO) 500 MG tablet  2 times daily     11/17/17 0707    ondansetron (ZOFRAN ODT) 4 MG disintegrating tablet  Every 8 hours PRN     11/17/17 0707    diazepam (VALIUM) 5 MG tablet  Every 8 hours PRN     11/17/17 0707       Note:  This document was prepared using Dragon voice recognition software and may include unintentional dictation errors.    Paulette Blanch, MD 11/17/17 [phone]

## 2017-11-17 NOTE — ED Provider Notes (Signed)
Reviewed patient's CT scan results with her.  Notified her of lung nodules and need for 2375-month follow-up given distant history of smoking.  Notified patient of fatty liver on CT abd, f/u with PCP, weight loss discussed   Jene EveryKinner, Taletha Twiford, MD 11/17/17 [phone]

## 2017-11-17 NOTE — ED Notes (Signed)
CT called.  Pt finished drinking contrast. 

## 2017-11-17 NOTE — ED Notes (Signed)
CT tech at the bedside to discuss oral contrast instructions.

## 2017-12-05 ENCOUNTER — Other Ambulatory Visit: Payer: Self-pay

## 2017-12-05 ENCOUNTER — Encounter: Payer: Self-pay | Admitting: Intensive Care

## 2017-12-05 ENCOUNTER — Emergency Department
Admission: EM | Admit: 2017-12-05 | Discharge: 2017-12-05 | Disposition: A | Discharge status: Home / Self Care | Payer: BC Managed Care – PPO | Attending: Emergency Medicine | Admitting: Emergency Medicine

## 2017-12-05 DIAGNOSIS — Z87891 Personal history of nicotine dependence: Secondary | ICD-10-CM | POA: Diagnosis not present

## 2017-12-05 DIAGNOSIS — R51 Headache: Secondary | ICD-10-CM | POA: Diagnosis present

## 2017-12-05 DIAGNOSIS — Z79899 Other long term (current) drug therapy: Secondary | ICD-10-CM | POA: Insufficient documentation

## 2017-12-05 DIAGNOSIS — G43709 Chronic migraine without aura, not intractable, without status migrainosus: Secondary | ICD-10-CM | POA: Diagnosis not present

## 2017-12-05 HISTORY — DX: Panic disorder (episodic paroxysmal anxiety): F41.0

## 2017-12-05 HISTORY — DX: Dizziness and giddiness: R42

## 2017-12-05 MED ORDER — CYCLOBENZAPRINE HCL 5 MG PO TABS: 5.0000 mg | ORAL_TABLET | Freq: Three times a day (TID) | ORAL | 0 refills | Status: DC | PRN

## 2017-12-05 MED ORDER — METOCLOPRAMIDE HCL 5 MG/ML IJ SOLN
20.0000 mg | Freq: Once | INTRAVENOUS | Status: AC
  Administered 2017-12-05: 20 mg via INTRAVENOUS
  Filled 2017-12-05: qty 4

## 2017-12-05 MED ORDER — DIPHENHYDRAMINE HCL 50 MG/ML IJ SOLN
25.0000 mg | Freq: Once | INTRAMUSCULAR | Status: AC
  Administered 2017-12-05: 25 mg via INTRAVENOUS
  Filled 2017-12-05: qty 1

## 2017-12-05 MED ORDER — ORPHENADRINE CITRATE 30 MG/ML IJ SOLN
60.0000 mg | INTRAMUSCULAR | Status: AC
  Administered 2017-12-05: 60 mg via INTRAVENOUS
  Filled 2017-12-05: qty 2

## 2017-12-05 MED ORDER — KETOROLAC TROMETHAMINE 10 MG PO TABS: 10.0000 mg | ORAL_TABLET | Freq: Three times a day (TID) | ORAL | 0 refills | Status: DC

## 2017-12-05 MED ORDER — SODIUM CHLORIDE 0.9 % IV BOLUS
1000.0000 mL | Freq: Once | INTRAVENOUS | Status: AC
  Administered 2017-12-05: 1000 mL via INTRAVENOUS

## 2017-12-05 MED ORDER — KETOROLAC TROMETHAMINE 30 MG/ML IJ SOLN
30.0000 mg | Freq: Once | INTRAMUSCULAR | Status: AC
  Administered 2017-12-05: 30 mg via INTRAVENOUS
  Filled 2017-12-05: qty 1

## 2017-12-05 NOTE — ED Triage Notes (Signed)
Patient presents with migraine since yesterday. Reports this migraine feels the same as all her others. C/O light and noise sensitivity. Meds at home not helping. A&O x4

## 2017-12-05 NOTE — Discharge Instructions (Addendum)
Take your home meds along with the Ketorolac and Flexeril, for headache pain relief. Follow-up with your provider as planned.

## 2017-12-07 NOTE — ED Provider Notes (Signed)
[facility] Emergency Department Provider Note ____________________________________________  Time seen: 1800  I have reviewed the triage vital signs and the nursing notes.  HISTORY  Chief Complaint  Migraine  HPI Tiffany Cuevas is a 32 y.o. female presents to the ED accompanied with family, for evaluation management of a persistent headache.  Patient with a history of migraines as well as panic disorder, presents with a productive headache for the last 2 days.  Patient reports her previous medications have not given her any benefit.  She also reports no relief with her current medications which include Parnate, nortriptyline, and hydroxyzine.  Patient admits she has not taken her nausea medication nor her hydroxyzine with this particular headache.  She denies any trauma, syncope, chest pain, shortness of breath, or fevers.  She does report light sensitivity and nausea.  Past Medical History:  Diagnosis Date  . Migraines   . Panic disorder   . Vertigo     There are no active problems to display for this patient.   Past Surgical History:  Procedure Laterality Date  . OVARIAN CYST REMOVAL    . WISDOM TOOTH EXTRACTION      Prior to Admission medications   Medication Sig Start Date End Date Taking? Authorizing Provider  cyclobenzaprine (FLEXERIL) 5 MG tablet Take 1 tablet (5 mg total) by mouth 3 (three) times daily as needed for muscle spasms. 12/05/17   Miyanna Wiersma, Charlesetta IvoryJenise V Bacon, PA-C  diazepam (VALIUM) 5 MG tablet Take 1 tablet (5 mg total) by mouth every 8 (eight) hours as needed for anxiety. 11/17/17   Jene EveryKinner, Robert, MD  hydrOXYzine (ATARAX/VISTARIL) 10 MG tablet Take 1 tablet (10 mg total) by mouth every 8 (eight) hours as needed for anxiety. 11/16/17   Payton Mccallumonty, [place], MD  ketorolac (TORADOL) 10 MG tablet Take 1 tablet (10 mg total) by mouth every 8 (eight) hours. 12/05/17   Ilyanna Baillargeon, Charlesetta IvoryJenise V Bacon, PA-C  nortriptyline (PAMELOR) 10 MG capsule Take 40 mg by mouth  at bedtime.     [provider]  ondansetron (ZOFRAN ODT) 4 MG disintegrating tablet Take 1 tablet (4 mg total) by mouth every 8 (eight) hours as needed for nausea or vomiting. 11/17/17   Jene EveryKinner, Robert, MD  topiramate (TOPAMAX) 100 MG tablet Take 100 mg by mouth daily.  09/25/17   [provider]    Allergies Amoxicillin; Imitrex [sumatriptan]; and Percocet [oxycodone-acetaminophen]  Family History  Problem Relation Age of Onset  . Healthy Mother   . Healthy Father     Social History Social History   Tobacco Use  . Smoking status: Former Games developermoker  . Smokeless tobacco: Never Used  Substance Use Topics  . Alcohol use: No  . Drug use: Never    Review of Systems  Constitutional: Negative for fever. Eyes: Negative for visual changes. ENT: Negative for sore throat. Cardiovascular: Negative for chest pain. Respiratory: Negative for shortness of breath. Gastrointestinal: Negative for abdominal pain, vomiting and diarrhea. Genitourinary: Negative for dysuria. Musculoskeletal: Negative for back pain. Skin: Negative for rash. Neurological: Negative for focal weakness or numbness.  Reports headache as above. ____________________________________________  PHYSICAL EXAM:  VITAL SIGNS: ED Triage Vitals  Enc Vitals Group     BP 12/05/17 1708 (!) 141/78     Pulse Rate 12/05/17 1708 98     Resp 12/05/17 1708 18     Temp 12/05/17 1708 98.3 F (36.8 C)     Temp Source 12/05/17 1708 Oral     SpO2 12/05/17  1708 99 %     Weight 12/05/17 1708 194 lb (88 kg)     Height 12/05/17 1708 5\' 3"  (1.6 m)     Head Circumference --      Peak Flow --      Pain Score 12/05/17 1716 9     Pain Loc --      Pain Edu? --      Excl. in [place]? --     Constitutional: Alert and oriented. Well appearing and in no distress. Head: Normocephalic and atraumatic. Eyes: Conjunctivae are normal. PERRL. Normal extraocular movements Ears: Canals clear. TMs intact bilaterally. Nose: No  congestion/rhinorrhea/epistaxis. Mouth/Throat: Mucous membranes are moist. Neck: Supple. Normal ROM Cardiovascular: Normal rate, regular rhythm. Normal distal pulses. Respiratory: Normal respiratory effort. No wheezes/rales/rhonchi. Gastrointestinal: Soft and nontender. No distention. Musculoskeletal: Nontender with normal range of motion in all extremities.  Neurologic:  Normal gait without ataxia. Normal speech and language. No gross focal neurologic deficits are appreciated. Skin:  Skin is warm, dry and intact. No rash noted. Psychiatric: Mood and affect are normal. Patient exhibits appropriate insight and judgment. ____________________________________________  PROCEDURES  Procedures NS 1000 ml IV Reglan 20 mg IVPB Toradol 30 mg IVP Diphenhydramine 25 mg IVP Orphenadrine 60 mg IVP ____________________________________________  INITIAL IMPRESSION / ASSESSMENT AND PLAN / ED COURSE  Patient reports resolution of headache pain.  She is resting comfortably in the room at the time of this disposition.  She will be discharged with instructions to follow-up with her primary provider in 2 days as scheduled.  Prescription for Flexeril as well as ketorolac are provided at the time of discharge.  Return precautions have been reviewed. ____________________________________________  FINAL CLINICAL IMPRESSION(S) / ED DIAGNOSES  Final diagnoses:  Chronic migraine without aura without status migrainosus, not intractable      Karmen Stabs, Charlesetta Ivory, PA-C 12/07/17 1931    Minna Antis, MD 12/09/17 1513

## 2018-02-05 ENCOUNTER — Encounter: Payer: Self-pay | Admitting: Physician Assistant

## 2018-02-05 ENCOUNTER — Emergency Department
Admission: EM | Admit: 2018-02-05 | Discharge: 2018-02-05 | Disposition: A | Discharge status: Home / Self Care | Payer: BC Managed Care – PPO | Attending: Emergency Medicine | Admitting: Emergency Medicine

## 2018-02-05 ENCOUNTER — Other Ambulatory Visit: Payer: Self-pay

## 2018-02-05 DIAGNOSIS — G43001 Migraine without aura, not intractable, with status migrainosus: Secondary | ICD-10-CM | POA: Insufficient documentation

## 2018-02-05 DIAGNOSIS — Z79899 Other long term (current) drug therapy: Secondary | ICD-10-CM | POA: Diagnosis not present

## 2018-02-05 DIAGNOSIS — R51 Headache: Secondary | ICD-10-CM | POA: Diagnosis present

## 2018-02-05 DIAGNOSIS — Z87891 Personal history of nicotine dependence: Secondary | ICD-10-CM | POA: Insufficient documentation

## 2018-02-05 MED ORDER — DIPHENHYDRAMINE HCL 50 MG/ML IJ SOLN
25.0000 mg | Freq: Once | INTRAMUSCULAR | Status: AC
  Administered 2018-02-05: 25 mg via INTRAVENOUS
  Filled 2018-02-05: qty 1

## 2018-02-05 MED ORDER — SODIUM CHLORIDE 0.9 % IV BOLUS
1000.0000 mL | Freq: Once | INTRAVENOUS | Status: AC
  Administered 2018-02-05: 1000 mL via INTRAVENOUS

## 2018-02-05 MED ORDER — METOCLOPRAMIDE HCL 5 MG/ML IJ SOLN
20.0000 mg | Freq: Once | INTRAMUSCULAR | Status: AC
  Administered 2018-02-05: 20 mg via INTRAVENOUS
  Filled 2018-02-05: qty 4

## 2018-02-05 MED ORDER — KETOROLAC TROMETHAMINE 30 MG/ML IJ SOLN
30.0000 mg | Freq: Once | INTRAMUSCULAR | Status: AC
  Administered 2018-02-05: 30 mg via INTRAVENOUS
  Filled 2018-02-05: qty 1

## 2018-02-05 MED ORDER — ORPHENADRINE CITRATE 30 MG/ML IJ SOLN
60.0000 mg | Freq: Two times a day (BID) | INTRAMUSCULAR | Status: DC
  Administered 2018-02-05: 60 mg via INTRAVENOUS
  Filled 2018-02-05: qty 2

## 2018-02-05 NOTE — ED Provider Notes (Signed)
[facility] Emergency Department Provider Note  ____________________________________________   First MD Initiated Contact with Patient 02/05/18 1148     (approximate)  I have reviewed the triage vital signs and the nursing notes.   HISTORY  Chief Complaint Migraine    HPI Tiffany Cuevas is a 32 y.o. female presents to the emergency department complaining of a migraine for 2 days.  She states she has a history of migraines and this is similar to previous migraines.  Her doctor is been trying to take her off of her migraine medications that she was having medication overuse headaches.  She is taking a antianxiety medicine which she cannot take the medication she was on.  She took 2 Fioricet yesterday without any relief.  She states she has nausea but no vomiting.  No fever or chills.  She has a neurologist that she sees frequently.    Past Medical History:  Diagnosis Date  . Migraines   . Panic disorder   . Vertigo     There are no active problems to display for this patient.   Past Surgical History:  Procedure Laterality Date  . OVARIAN CYST REMOVAL    . WISDOM TOOTH EXTRACTION      Prior to Admission medications   Medication Sig Start Date End Date Taking? Authorizing Provider  butalbital-acetaminophen-caffeine (FIORICET, ESGIC) 50-325-40 MG tablet Take 1 tablet by mouth 2 (two) times daily as needed for headache.   Yes [provider]  clonazePAM (KLONOPIN) 0.5 MG tablet Take 0.5 mg by mouth 2 (two) times daily as needed for anxiety.   Yes [provider]  escitalopram (LEXAPRO) 10 MG tablet Take 10 mg by mouth daily.   Yes [provider]  rizatriptan (MAXALT) 10 MG tablet Take 10 mg by mouth as needed for migraine. May repeat in 2 hours if needed   Yes [provider]  venlafaxine XR (EFFEXOR-XR) 75 MG 24 hr capsule Take 75 mg by mouth daily with breakfast.   Yes [provider]  hydrOXYzine  (ATARAX/VISTARIL) 10 MG tablet Take 1 tablet (10 mg total) by mouth every 8 (eight) hours as needed for anxiety. 11/16/17   Payton Mccallum, MD    Allergies Amoxicillin; Imitrex [sumatriptan]; and Percocet [oxycodone-acetaminophen]  Family History  Problem Relation Age of Onset  . Healthy Mother   . Healthy Father     Social History Social History   Tobacco Use  . Smoking status: Former Games developer  . Smokeless tobacco: Never Used  Substance Use Topics  . Alcohol use: No  . Drug use: Never    Review of Systems  Constitutional: No fever/chills, positive for headache Eyes: No visual changes. ENT: No sore throat. Respiratory: Denies cough Genitourinary: Negative for dysuria. Musculoskeletal: Negative for back pain. Skin: Negative for rash.    ____________________________________________   PHYSICAL EXAM:  VITAL SIGNS: ED Triage Vitals  Enc Vitals Group     BP 02/05/18 1120 125/88     Pulse Rate 02/05/18 1120 (!) 107     Resp 02/05/18 1120 20     Temp 02/05/18 1120 98.7 F (37.1 C)     Temp Source 02/05/18 1120 Oral     SpO2 02/05/18 1120 97 %     Weight 02/05/18 1124 190 lb (86.2 kg)     Height 02/05/18 1124 5\' 3"  (1.6 m)     Head Circumference --      Peak Flow --      Pain Score 02/05/18  1124 10     Pain Loc --      Pain Edu? --      Excl. in [place]? --     Constitutional: Alert and oriented. Well appearing and in no acute distress. Eyes: Conjunctivae are normal.  Head: Atraumatic. Nose: No congestion/rhinnorhea. Mouth/Throat: Mucous membranes are moist.   Neck:  supple no lymphadenopathy noted Cardiovascular: Normal rate, regular rhythm. Heart sounds are normal Respiratory: Normal respiratory effort.  No retractions, lungs c t a  GU: deferred Musculoskeletal: FROM all extremities, warm and well perfused Neurologic:  Normal speech and language.  Patient is alert.  Cranial nerves II through XII grossly intact Skin:  Skin is warm, dry and intact. No rash  noted. Psychiatric: Mood and affect are normal. Speech and behavior are normal.  ____________________________________________   LABS (all labs ordered are listed, but only abnormal results are displayed)  Labs Reviewed - No data to display ____________________________________________   ____________________________________________  RADIOLOGY    ____________________________________________   PROCEDURES  Procedure(s) performed: Saline lock, 1 L normal saline IV, Toradol 30 mg IV, Benadryl 25 mg IV, Reglan 20 mg IV, Norflex 60 mg IV  Procedures    ____________________________________________   INITIAL IMPRESSION / ASSESSMENT AND PLAN / ED COURSE  Pertinent labs & imaging results that were available during my care of the patient were reviewed by me and considered in my medical decision making (see chart for details).   Patient is 32 year old female presents emergency department complaining of a migraine.  Patient has a history of migraines.  Migraine started yesterday she took 2 Fioricet without any relief.  On physical exam patient appears to be in a mild amount of pain.  She is covering her eyes and wants the room dark.  Remainder the exam is unremarkable.  Saline lock, normal saline 1 L, Toradol 30 mg IV, Reglan 20 mg IV, Benadryl 25 mg IV, Norflex 60 mg IV  Patient states she has complete relief of her migraine with these medications.  She was instructed to stay hydrated as this will help prevent some of the migraines.  She is to return to the emergency department if worsening.  See her regular doctor as needed.  She states she understands will comply.  She is discharged in stable condition     As part of my medical decision making, I reviewed the following data within the electronic MEDICAL RECORD NUMBER Nursing notes reviewed and incorporated, Old chart reviewed, Notes from prior ED visits and [place] Controlled Substance  Database  ____________________________________________   FINAL CLINICAL IMPRESSION(S) / ED DIAGNOSES  Final diagnoses:  Migraine without aura and with status migrainosus, not intractable      NEW MEDICATIONS STARTED DURING THIS VISIT:  Current Discharge Medication List       Note:  This document was prepared using Dragon voice recognition software and may include unintentional dictation errors.    Faythe Ghee, PA-C 02/05/18 1335    Dionne Bucy, MD 02/05/18 [phone]

## 2018-02-05 NOTE — ED Notes (Signed)
See triage note  States she developed migraine yesterday  Hx of same but states this headache is worse  States she took meds yesterday w/o relief  States she did not take anything today

## 2018-02-05 NOTE — ED Triage Notes (Signed)
Pt states that she started with a migraine yesterday and took 2 fioicet without relief, pt states that the pain is worse today. Pt states that she has them all the time just not usually this bad, reports that she sees a neurologist

## 2018-02-05 NOTE — Discharge Instructions (Addendum)
Drink plenty of fluids.  Stay hydrated this will help decrease the amount of migraines that you have.  Return to the emergency department as needed

## 2018-02-06 ENCOUNTER — Other Ambulatory Visit: Payer: Self-pay

## 2018-02-06 ENCOUNTER — Emergency Department
Admission: EM | Admit: 2018-02-06 | Discharge: 2018-02-06 | Disposition: A | Discharge status: Home / Self Care | Payer: BC Managed Care – PPO | Attending: Emergency Medicine | Admitting: Emergency Medicine

## 2018-02-06 DIAGNOSIS — Z79899 Other long term (current) drug therapy: Secondary | ICD-10-CM | POA: Insufficient documentation

## 2018-02-06 DIAGNOSIS — G43001 Migraine without aura, not intractable, with status migrainosus: Secondary | ICD-10-CM | POA: Diagnosis not present

## 2018-02-06 DIAGNOSIS — R51 Headache: Secondary | ICD-10-CM | POA: Diagnosis present

## 2018-02-06 DIAGNOSIS — Z87891 Personal history of nicotine dependence: Secondary | ICD-10-CM | POA: Diagnosis not present

## 2018-02-06 MED ORDER — KETOROLAC TROMETHAMINE 30 MG/ML IJ SOLN
15.0000 mg | Freq: Once | INTRAMUSCULAR | Status: AC
  Administered 2018-02-06: 15 mg via INTRAVENOUS
  Filled 2018-02-06: qty 1

## 2018-02-06 MED ORDER — DEXAMETHASONE SODIUM PHOSPHATE 10 MG/ML IJ SOLN
10.0000 mg | Freq: Once | INTRAMUSCULAR | Status: AC
  Administered 2018-02-06: 10 mg via INTRAVENOUS
  Filled 2018-02-06: qty 1

## 2018-02-06 MED ORDER — KETOROLAC TROMETHAMINE 10 MG PO TABS: 10.0000 mg | ORAL_TABLET | Freq: Four times a day (QID) | ORAL | 0 refills | Status: DC | PRN

## 2018-02-06 MED ORDER — SODIUM CHLORIDE 0.9 % IV BOLUS: 1000.0000 mL | Freq: Once | INTRAVENOUS | Status: DC

## 2018-02-06 MED ORDER — PROPOFOL 10 MG/ML IV BOLUS
50.0000 mg | Freq: Once | INTRAVENOUS | Status: AC
  Administered 2018-02-06: 50 mg via INTRAVENOUS
  Filled 2018-02-06: qty 20

## 2018-02-06 NOTE — Discharge Instructions (Signed)
Please make sure you remain well hydrated and follow up with your PMD and your Neurologist for a recheck.  Return to the ED sooner for any concerns.  It was a pleasure to take care of you today, and thank you for coming to our emergency department.  If you have any questions or concerns before leaving please ask the nurse to grab me and I'm more than happy to go through your aftercare instructions again.  If you have any concerns once you are home that you are not improving or are in fact getting worse before you can make it to your follow-up appointment, please do not hesitate to call 911 and come back for further evaluation.  Merrily Brittle, MD

## 2018-02-06 NOTE — ED Provider Notes (Signed)
[facility] Emergency Department Provider Note  ____________________________________________   First MD Initiated Contact with Patient 02/06/18 [phone]     (approximate)  I have reviewed the triage vital signs and the nursing notes.   HISTORY  Chief Complaint Headache    HPI Tiffany Cuevas is a 32 y.o. female self presents to the emergency department with roughly 48 hours of gradual onset not maximal onset left frontal throbbing headache associated with nausea and photophobia that is identical to previous migraine headache she is had in the past.  She is followed by neurology and has failed multiple triptan's.  She has also tried taking Fioricet recently however found it ineffective and abruptly stopped the Fioricet.  She was seen in our emergency department yesterday and was given 30 mg of IV Toradol, 10 mg of IV Reglan, 60 mg of IV Norflex, and 1 L of fluid along with 50 mg of IV Benadryl.  He did seem to improve her symptoms although once she got home she fell asleep and when she awoke her symptoms had returned.  She has no fevers or chills.  No neck pain.  She is frustrated that she is unable to stop these headaches from occurring.  She has previously taken Topamax and she continues to take magnesium every day.  Her symptoms were gradual onset not maximal onset now severe and nothing seems to make them better or worse in particular.  She is requesting Toradol specifically.    Past Medical History:  Diagnosis Date  . Migraines   . Panic disorder   . Vertigo     There are no active problems to display for this patient.   Past Surgical History:  Procedure Laterality Date  . OVARIAN CYST REMOVAL    . WISDOM TOOTH EXTRACTION      Prior to Admission medications   Medication Sig Start Date End Date Taking? Authorizing Provider  butalbital-acetaminophen-caffeine (FIORICET, ESGIC) 50-325-40 MG tablet Take 1 tablet by mouth 2 (two) times daily as needed for  headache.    [provider]  clonazePAM (KLONOPIN) 0.5 MG tablet Take 0.5 mg by mouth 2 (two) times daily as needed for anxiety.    [provider]  escitalopram (LEXAPRO) 10 MG tablet Take 10 mg by mouth daily.    [provider]  hydrOXYzine (ATARAX/VISTARIL) 10 MG tablet Take 1 tablet (10 mg total) by mouth every 8 (eight) hours as needed for anxiety. 11/16/17   Payton Mccallum, MD  ketorolac (TORADOL) 10 MG tablet Take 1 tablet (10 mg total) by mouth every 6 (six) hours as needed for severe pain. 02/06/18   Merrily Brittle, MD  rizatriptan (MAXALT) 10 MG tablet Take 10 mg by mouth as needed for migraine. May repeat in 2 hours if needed    [provider]  venlafaxine XR (EFFEXOR-XR) 75 MG 24 hr capsule Take 75 mg by mouth daily with breakfast.    [provider]    Allergies Amoxicillin; Imitrex [sumatriptan]; and Percocet [oxycodone-acetaminophen]  Family History  Problem Relation Age of Onset  . Healthy Mother   . Healthy Father     Social History Social History   Tobacco Use  . Smoking status: Former Games developer  . Smokeless tobacco: Never Used  Substance Use Topics  . Alcohol use: No  . Drug use: Never    Review of Systems Constitutional: No fever/chills Eyes: Positive for visual changes. ENT: No sore throat. Cardiovascular: Denies chest pain. Respiratory: Denies shortness of breath.  Gastrointestinal: No abdominal pain.  Positive for nausea, no vomiting.  No diarrhea.  No constipation. Genitourinary: Negative for dysuria. Musculoskeletal: Negative for back pain. Skin: Negative for rash. Neurological: Positive for headache   ____________________________________________   PHYSICAL EXAM:  VITAL SIGNS: ED Triage Vitals  Enc Vitals Group     BP 02/06/18 0412 (!) 141/87     Pulse Rate 02/06/18 0412 (!) 104     Resp 02/06/18 0412 16     Temp 02/06/18 0412 97.8 F (36.6 C)     Temp Source 02/06/18 0412 Oral     SpO2  02/06/18 0412 96 %     Weight 02/06/18 0409 190 lb (86.2 kg)     Height 02/06/18 0409 5\' 3"  (1.6 m)     Head Circumference --      Peak Flow --      Pain Score 02/06/18 0409 10     Pain Loc --      Pain Edu? --      Excl. in GC? --     Constitutional: Alert and oriented x4 appears miserable curled on the bed with her hoodie drawn up shielding her eyes Eyes: PERRL EOMI. midrange and brisk Head: Atraumatic. Nose: No congestion/rhinnorhea. Mouth/Throat: No trismus Neck: No stridor.  No meningismus Cardiovascular: Increased rate, regular rhythm. Grossly normal heart sounds.  Good peripheral circulation. Respiratory: Normal respiratory effort.  No retractions. Lungs CTAB and moving good air Gastrointestinal: Soft nontender Musculoskeletal: No lower extremity edema   Neurologic:  Normal speech and language. No gross focal neurologic deficits are appreciated. Skin:  Skin is warm, dry and intact. No rash noted. Psychiatric: Mood and affect are normal. Speech and behavior are normal.    ____________________________________________   DIFFERENTIAL includes but not limited to  Migraine headache, Fioricet withdrawal headache, dehydration, intracerebral hemorrhage ____________________________________________   LABS (all labs ordered are listed, but only abnormal results are displayed)  Labs Reviewed - No data to display   __________________________________________  EKG   ____________________________________________  RADIOLOGY   ____________________________________________   PROCEDURES  Procedure(s) performed: no  Procedures  Critical Care performed: no  ____________________________________________   INITIAL IMPRESSION / ASSESSMENT AND PLAN / ED COURSE  Pertinent labs & imaging results that were available during my care of the patient were reviewed by me and considered in my medical decision making (see chart for details).   As part of my medical decision making,  I reviewed the following data within the electronic MEDICAL RECORD NUMBER History obtained from family if available, nursing notes, old chart and ekg, as well as notes from prior ED visits.  The patient arrived in the emergency department with recurrent severe migraine headache that it failed a typical migraine cocktail yesterday.  This headache is identical to previous headache she is had before.  I discussed with her low-dose propofol versus another attempt at a migraine cocktail here today and she was interested in trying a new approach as she has limited options that she cannot take triptans and has failed Fioricet.  I myself administered a total of 50 mg of propofol 10 mg at a time each dose given at 2-minute increments while the patient was on continuous pulse oximetry.  At no point did the patient lose consciousness.  At no point was this procedural sedation.  This low micro-dosing of propofol is analogous to pain control dosing of ketamine and is well studied and established in the literature.  She had significant improvement in her migraine headache.  She  did request a dose of Toradol and I gave her a dose of dexamethasone as we had the IV to hopefully help prevent recurrence.  She requested oral Toradol for home and I will refer her back to both her primary care physician and her neurologist.  Strict return precautions have been given.      ____________________________________________   FINAL CLINICAL IMPRESSION(S) / ED DIAGNOSES  Final diagnoses:  Migraine without aura and with status migrainosus, not intractable      NEW MEDICATIONS STARTED DURING THIS VISIT:  New Prescriptions   KETOROLAC (TORADOL) 10 MG TABLET    Take 1 tablet (10 mg total) by mouth every 6 (six) hours as needed for severe pain.     Note:  This document was prepared using Dragon voice recognition software and may include unintentional dictation errors.     Merrily Brittle, MD 02/06/18 [phone]

## 2018-02-06 NOTE — ED Notes (Signed)
Pt reports being in ED yesterday for same co HA, hx of the same, CMS and neuro intact

## 2018-02-06 NOTE — ED Triage Notes (Signed)
Pt to the er for migraine. Pt was seen at 1000 yesterday here and given medications and fluids, had improvement and then at 1700 the pain was back. Pt did not take fioriciet at home because it doesn't work.

## 2018-05-25 ENCOUNTER — Other Ambulatory Visit: Payer: Self-pay

## 2018-05-25 ENCOUNTER — Ambulatory Visit
Admission: EM | Admit: 2018-05-25 | Discharge: 2018-05-25 | Disposition: A | Discharge status: Home / Self Care | Payer: BC Managed Care – PPO | Attending: Family Medicine | Admitting: Family Medicine

## 2018-05-25 DIAGNOSIS — R112 Nausea with vomiting, unspecified: Secondary | ICD-10-CM | POA: Diagnosis not present

## 2018-05-25 DIAGNOSIS — J069 Acute upper respiratory infection, unspecified: Secondary | ICD-10-CM | POA: Diagnosis not present

## 2018-05-25 LAB — CBC WITH DIFFERENTIAL/PLATELET
Abs Immature Granulocytes: 0 10*3/uL (ref 0.00–0.07)
Basophils Absolute: 0 10*3/uL (ref 0.0–0.1)
Basophils Relative: 0 %
EOS PCT: 0 %
Eosinophils Absolute: 0 10*3/uL (ref 0.0–0.5)
HCT: 46.3 % — ABNORMAL HIGH (ref 36.0–46.0)
HEMOGLOBIN: 16.1 g/dL — AB (ref 12.0–15.0)
Immature Granulocytes: 0 %
Lymphocytes Relative: 35 %
Lymphs Abs: 2.5 10*3/uL (ref 0.7–4.0)
MCH: 31.2 pg (ref 26.0–34.0)
MCHC: 34.8 g/dL (ref 30.0–36.0)
MCV: 89.7 fL (ref 80.0–100.0)
Monocytes Absolute: 0.4 10*3/uL (ref 0.1–1.0)
Monocytes Relative: 5 %
Neutro Abs: 4.2 10*3/uL (ref 1.7–7.7)
Neutrophils Relative %: 60 %
Platelets: 243 10*3/uL (ref 150–400)
RBC: 5.16 MIL/uL — ABNORMAL HIGH (ref 3.87–5.11)
RDW: 12.5 % (ref 11.5–15.5)
WBC: 7.1 10*3/uL (ref 4.0–10.5)
nRBC: 0 % (ref 0.0–0.2)

## 2018-05-25 LAB — COMPREHENSIVE METABOLIC PANEL
ALK PHOS: 56 U/L (ref 38–126)
ALT: 20 U/L (ref 0–44)
AST: 20 U/L (ref 15–41)
Albumin: 4.7 g/dL (ref 3.5–5.0)
Anion gap: 10 (ref 5–15)
BUN: 17 mg/dL (ref 6–20)
CO2: 24 mmol/L (ref 22–32)
Calcium: 9.4 mg/dL (ref 8.9–10.3)
Chloride: 104 mmol/L (ref 98–111)
Creatinine, Ser: 0.76 mg/dL (ref 0.44–1.00)
GFR calc Af Amer: >60 mL/min (ref >60)
GFR calc non Af Amer: >60 mL/min (ref >60)
GLUCOSE: 93 mg/dL (ref 70–99)
Potassium: 4.8 mmol/L (ref 3.5–5.1)
Sodium: 138 mmol/L (ref 135–145)
Total Bilirubin: 0.9 mg/dL (ref 0.3–1.2)
Total Protein: 7.5 g/dL (ref 6.5–8.1)

## 2018-05-25 MED ORDER — ONDANSETRON 8 MG PO TBDP: 8.0000 mg | ORAL_TABLET | Freq: Three times a day (TID) | ORAL | 0 refills | Status: DC | PRN

## 2018-05-25 MED ORDER — PREDNISONE 20 MG PO TABS: 20.0000 mg | ORAL_TABLET | Freq: Every day | ORAL | 0 refills | Status: AC

## 2018-05-25 NOTE — ED Provider Notes (Signed)
[facility]    CSN: [id number] Arrival date & time: 05/25/18  1415     History   Chief Complaint Chief Complaint  Patient presents with  . Emesis    APPT    HPI Tiffany Cuevas is a 33 y.o. female.   The history is provided by the patient.  URI  Presenting symptoms: congestion and cough   Severity:  Moderate Onset quality:  Sudden Duration:  5 days Timing:  Constant Progression:  Unchanged Chronicity:  New Relieved by:  Nothing Ineffective treatments:  Prescription medications and OTC medications Associated symptoms: no wheezing   Risk factors: recent illness and sick contacts     Past Medical History:  Diagnosis Date  . Migraines   . Panic disorder   . Vertigo     There are no active problems to display for this patient.   Past Surgical History:  Procedure Laterality Date  . OVARIAN CYST REMOVAL    . WISDOM TOOTH EXTRACTION      OB History   No obstetric history on file.      Home Medications    Prior to Admission medications   Medication Sig Start Date End Date Taking? Authorizing Provider  AJOVY 225 MG/1.5ML SOSY INJECT 225 MG SUBCUTANEOUSLY EVERY 28 (TWENTY-EIGHT) DAYS 05/17/18  Yes [provider]  escitalopram (LEXAPRO) 10 MG tablet Take 10 mg by mouth daily.   Yes [provider]  SUMAtriptan (IMITREX) 100 MG tablet  05/22/18  Yes [provider]  venlafaxine XR (EFFEXOR-XR) 75 MG 24 hr capsule Take 75 mg by mouth daily with breakfast.   Yes [provider]  butalbital-acetaminophen-caffeine (FIORICET, ESGIC) 50-325-40 MG tablet Take 1 tablet by mouth 2 (two) times daily as needed for headache.    [provider]  clonazePAM (KLONOPIN) 0.5 MG tablet Take 0.5 mg by mouth 2 (two) times daily as needed for anxiety.    [provider]  hydrOXYzine (ATARAX/VISTARIL) 10 MG tablet Take 1 tablet (10 mg total) by mouth every 8 (eight) hours as needed for anxiety. 11/16/17   Payton Mccallumonty, Calisha Tindel, MD    ketorolac (TORADOL) 10 MG tablet Take 1 tablet (10 mg total) by mouth every 6 (six) hours as needed for severe pain. 02/06/18   Merrily Brittleifenbark, Neil, MD  ondansetron (ZOFRAN ODT) 8 MG disintegrating tablet Take 1 tablet (8 mg total) by mouth every 8 (eight) hours as needed. 05/25/18   Payton Mccallumonty, Flora Ratz, MD  predniSONE (DELTASONE) 20 MG tablet Take 1 tablet (20 mg total) by mouth daily. 05/25/18   Payton Mccallumonty, Darcy Barbara, MD  rizatriptan (MAXALT) 10 MG tablet Take 10 mg by mouth as needed for migraine. May repeat in 2 hours if needed    [provider]    Family History Family History  Problem Relation Age of Onset  . Healthy Mother   . Healthy Father     Social History Social History   Tobacco Use  . Smoking status: Former Games developermoker  . Smokeless tobacco: Never Used  Substance Use Topics  . Alcohol use: No  . Drug use: Never     Allergies   Amoxicillin and Percocet [oxycodone-acetaminophen]   Review of Systems Review of Systems  HENT: Positive for congestion.   Respiratory: Positive for cough. Negative for wheezing.      Physical Exam Triage Vital Signs ED Triage Vitals  Enc Vitals Group     BP 05/25/18 1431 113/84     Pulse Rate 05/25/18 1431 (!) 107  Resp 05/25/18 1431 18     Temp 05/25/18 1431 98.9 F (37.2 C)     Temp Source 05/25/18 1431 Oral     SpO2 05/25/18 1431 100 %     Weight 05/25/18 1428 170 lb (77.1 kg)     Height 05/25/18 1428 5\' 3"  (1.6 m)     Head Circumference --      Peak Flow --      Pain Score 05/25/18 1428 7     Pain Loc --      Pain Edu? --      Excl. in GC? --    No data found.  Updated Vital Signs BP 113/84 (BP Location: Left Arm)   Pulse (!) 107   Temp 98.9 F (37.2 C) (Oral)   Resp 18   Ht 5\' 3"  (1.6 m)   Wt 77.1 kg   SpO2 100%   BMI 30.11 kg/m   Visual Acuity Right Eye Distance:   Left Eye Distance:   Bilateral Distance:    Right Eye Near:   Left Eye Near:    Bilateral Near:     Physical Exam Vitals signs and nursing  note reviewed.  Constitutional:      General: She is not in acute distress.    Appearance: She is well-developed. She is not toxic-appearing or diaphoretic.  HENT:     Head: Normocephalic and atraumatic.     Right Ear: Tympanic membrane, ear canal and external ear normal.     Left Ear: Tympanic membrane, ear canal and external ear normal.     Nose: Rhinorrhea present.     Mouth/Throat:     Pharynx: Uvula midline. No oropharyngeal exudate.  Eyes:     General: No scleral icterus.       Right eye: No discharge.        Left eye: No discharge.     Pupils: Pupils are equal, round, and reactive to light.  Neck:     Musculoskeletal: Normal range of motion and neck supple.     Thyroid: No thyromegaly.  Cardiovascular:     Rate and Rhythm: Normal rate and regular rhythm.     Heart sounds: Normal heart sounds.  Pulmonary:     Effort: Pulmonary effort is normal. No respiratory distress.     Breath sounds: Normal breath sounds. No stridor. No wheezing, rhonchi or rales.  Abdominal:     General: Bowel sounds are normal. There is no distension.     Palpations: Abdomen is soft. There is no mass.     Tenderness: There is no abdominal tenderness. There is no right CVA tenderness, left CVA tenderness, guarding or rebound.     Hernia: No hernia is present.  Lymphadenopathy:     Cervical: No cervical adenopathy.  Neurological:     Mental Status: She is alert.      UC Treatments / Results  Labs (all labs ordered are listed, but only abnormal results are displayed) Labs Reviewed  CBC WITH DIFFERENTIAL/PLATELET - Abnormal; Notable for the following components:      Result Value   RBC 5.16 (*)    Hemoglobin 16.1 (*)    HCT 46.3 (*)    All other components within normal limits  COMPREHENSIVE METABOLIC PANEL    EKG None  Radiology No results found.  Procedures Procedures (including critical care time)  Medications Ordered in UC Medications - No data to display  Initial Impression  / Assessment and Plan / UC Course  I  have reviewed the triage vital signs and the nursing notes.  Pertinent labs & imaging results that were available during my care of the patient were reviewed by me and considered in my medical decision making (see chart for details).      Final Clinical Impressions(s) / UC Diagnoses   Final diagnoses:  Viral URI  Non-intractable vomiting with nausea, unspecified vomiting type    ED Prescriptions    Medication Sig Dispense Auth. Provider   predniSONE (DELTASONE) 20 MG tablet Take 1 tablet (20 mg total) by mouth daily. 5 tablet Cordarrell Sane, Pamala Hurry, MD   ondansetron (ZOFRAN ODT) 8 MG disintegrating tablet Take 1 tablet (8 mg total) by mouth every 8 (eight) hours as needed. 6 tablet Payton Mccallum, MD      1. Lab results and diagnosis reviewed with patient 2. rx as per orders above; reviewed possible side effects, interactions, risks and benefits  3. Recommend supportive treatment with increased fluids 4. Follow-up prn if symptoms worsen or don't improve   Controlled Substance Prescriptions [place] Controlled Substance Registry consulted? Not Applicable   Payton Mccallum, MD 05/25/18 [phone]

## 2018-05-25 NOTE — ED Triage Notes (Signed)
Patient complains of emesis, nausea, sweating, chest congestion and cough that started on Saturday. States that child was positive for the flu so they treated patient as well with Tamiflu. Patient states that she finished tamiflu on Sunday but symptoms have remained, she thought originally this could be a side effect. Patient states that she has noticed getting "winded" with walking.

## 2019-02-17 ENCOUNTER — Other Ambulatory Visit: Payer: Self-pay | Admitting: *Deleted

## 2019-02-17 DIAGNOSIS — Z20822 Contact with and (suspected) exposure to covid-19: Secondary | ICD-10-CM

## 2019-02-18 LAB — NOVEL CORONAVIRUS, NAA: SARS-CoV-2, NAA: NOT DETECTED

## 2019-06-11 ENCOUNTER — Ambulatory Visit: Payer: BC Managed Care – PPO | Attending: Internal Medicine

## 2019-06-11 DIAGNOSIS — Z23 Encounter for immunization: Secondary | ICD-10-CM | POA: Insufficient documentation

## 2019-06-11 NOTE — Progress Notes (Signed)
   Covid-19 Vaccination Clinic  Name:  Tiffany Cuevas    MRN: [id number] DOB: 08-11-1985  06/11/2019  [name] was observed post Covid-19 immunization for 15 minutes without incidence. She was provided with Vaccine Information Sheet and instruction to access the V-Safe system.   [name] was instructed to call 911 with any severe reactions post vaccine: Marland Kitchen Difficulty breathing  . Swelling of your face and throat  . A fast heartbeat  . A bad rash all over your body  . Dizziness and weakness    Immunizations Administered    Name Date Dose VIS Date Route   Moderna COVID-19 Vaccine 06/11/2019  3:25 PM 0.5 mL 03/15/2019 Intramuscular   Manufacturer: Moderna   Lot: [id number]   NDC: 01655-374-82

## 2019-07-09 ENCOUNTER — Ambulatory Visit: Payer: BC Managed Care – PPO | Attending: Internal Medicine

## 2019-07-09 DIAGNOSIS — Z23 Encounter for immunization: Secondary | ICD-10-CM

## 2019-07-09 NOTE — Progress Notes (Signed)
   Covid-19 Vaccination Clinic  Name:  FLORIENE JESCHKE    MRN: [id number] DOB: April 23, 1985  07/09/2019  Ms. Mcauliffe was observed post Covid-19 immunization for 15 minutes without incident. She was provided with Vaccine Information Sheet and instruction to access the V-Safe system.   Ms. Heiser was instructed to call 911 with any severe reactions post vaccine: Marland Kitchen Difficulty breathing  . Swelling of face and throat  . A fast heartbeat  . A bad rash all over body  . Dizziness and weakness   Immunizations Administered    Name Date Dose VIS Date Route   Moderna COVID-19 Vaccine 07/09/2019 12:39 PM 0.5 mL 03/15/2019 Intramuscular   Manufacturer: Gala Murdoch   Lot: [id number]   NDC: 29924-268-34

## 2019-12-09 IMAGING — CT CT ABD-PELV W/ CM
2 of 4 series · 14 of 46 positions shown, 16 images · IV contrast (omnipaque)
Comparison: None.

CLINICAL DATA: Chest and abdominal pain for 2 weeks

EXAM:
CT ANGIOGRAPHY CHEST
CT ABDOMEN AND PELVIS WITH CONTRAST
TECHNIQUE: Multidetector CT imaging of the chest was performed using the
standard protocol during bolus administration of intravenous
contrast. Multiplanar CT image reconstructions and MIPs were
obtained to evaluate the vascular anatomy. Multidetector CT imaging
of the abdomen and pelvis was performed using the standard protocol
during bolus administration of intravenous contrast.
CONTRAST:  100mL OMNIPAQUE IOHEXOL 350 MG/ML SOLN

[Series 2: routine abd/pel with · axial · 0.69mm/px · z∈[-836,-366]mm · 11 of 104 slices shown, 13 images]
[im 5/104  soft-tissue]
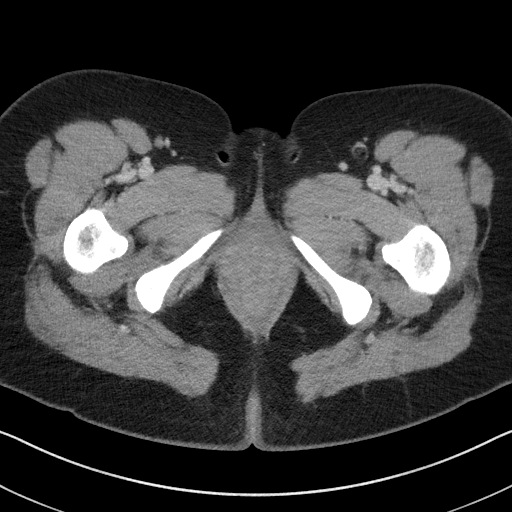
[im 5/104  bone]
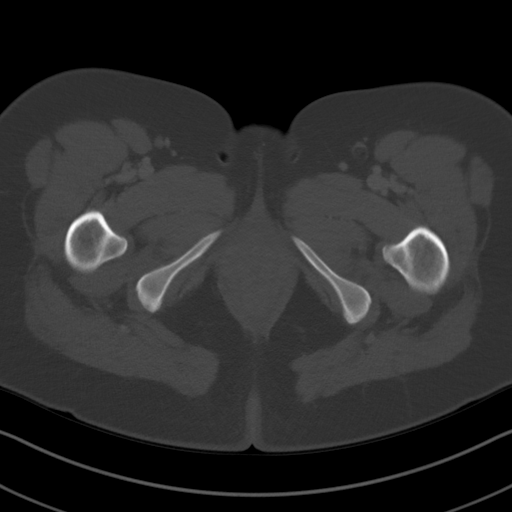
[im 15/104  soft-tissue]
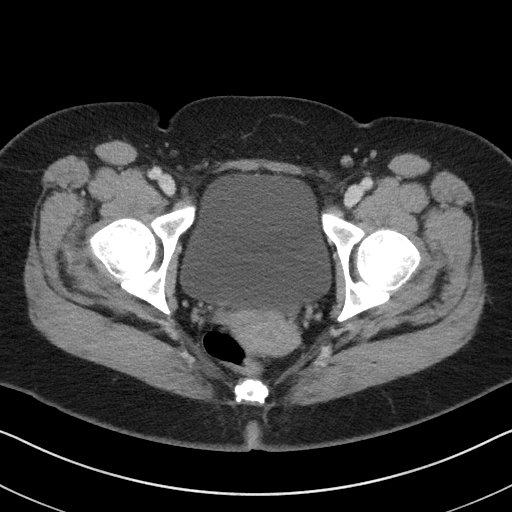
[im 24/104  soft-tissue]
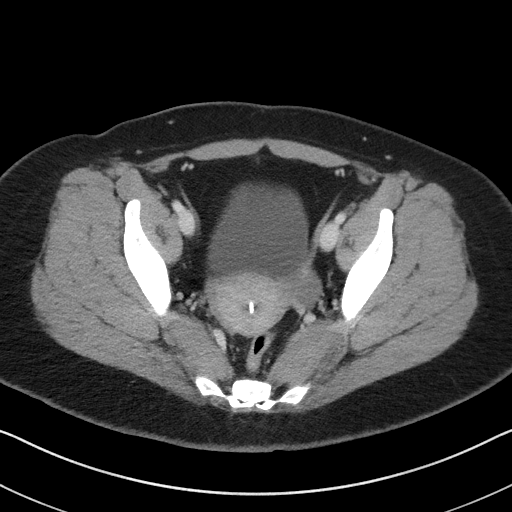
[im 33/104  soft-tissue]
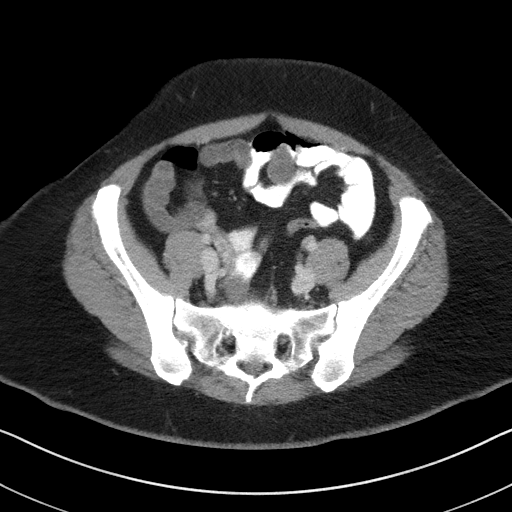
[im 43/104  soft-tissue]
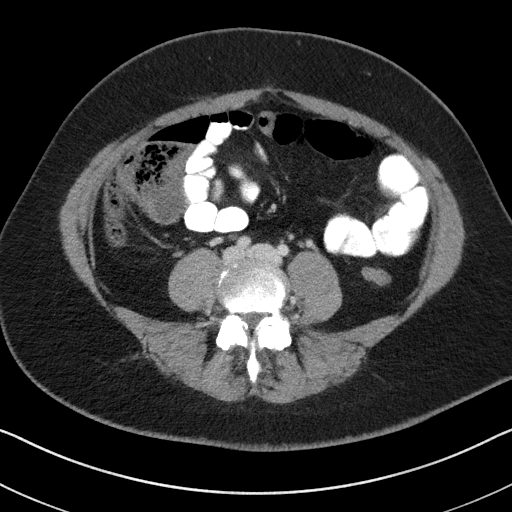
[im 52/104  soft-tissue]
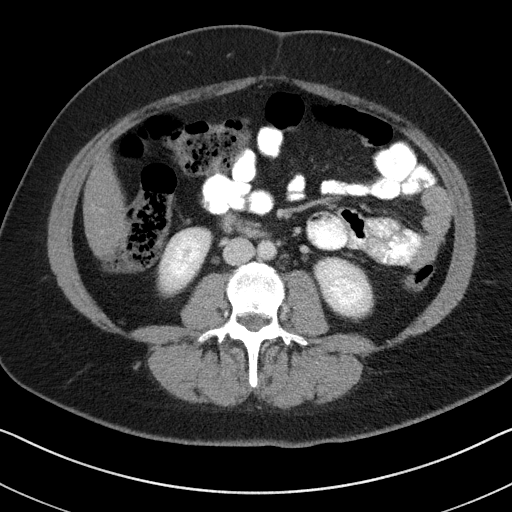
[im 61/104  soft-tissue]
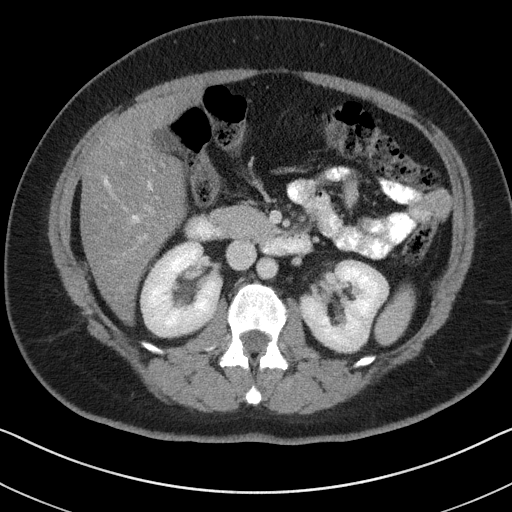
[im 71/104  soft-tissue]
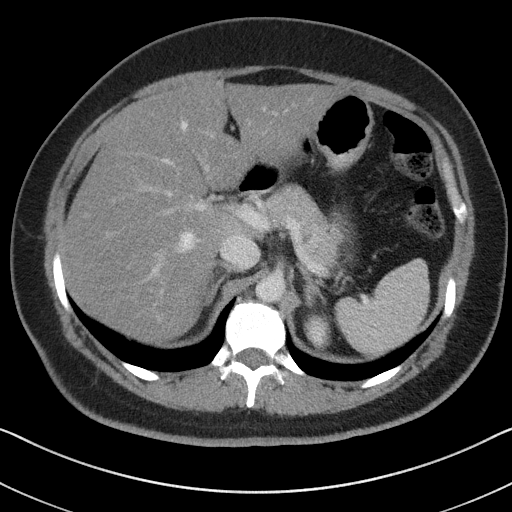
[im 80/104  soft-tissue]
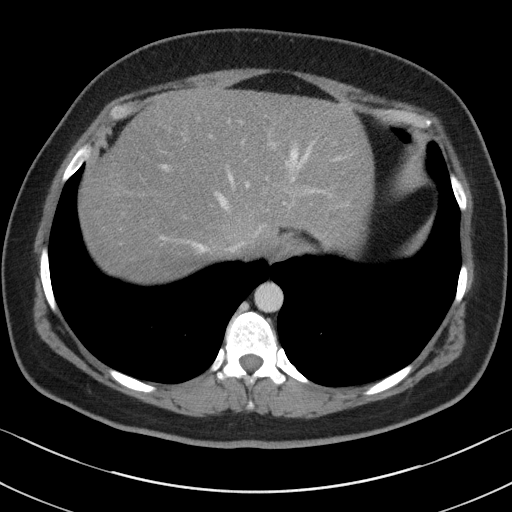
[im 80/104  bone]
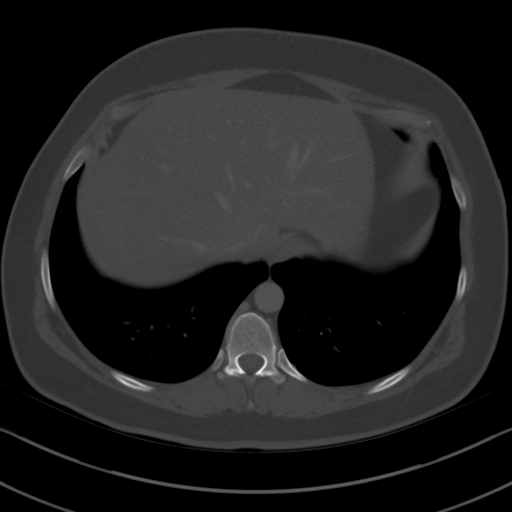
[im 89/104  soft-tissue]
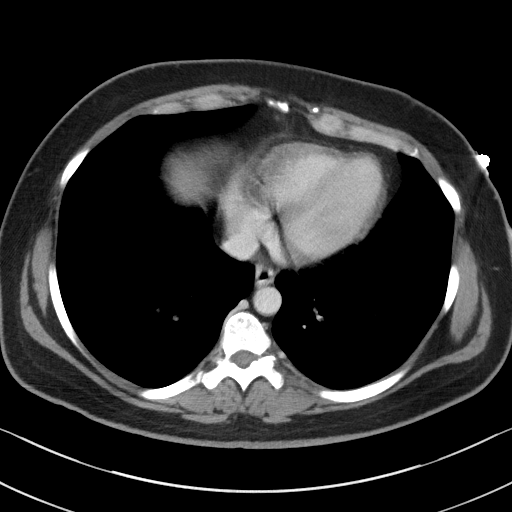
[im 99/104  soft-tissue]
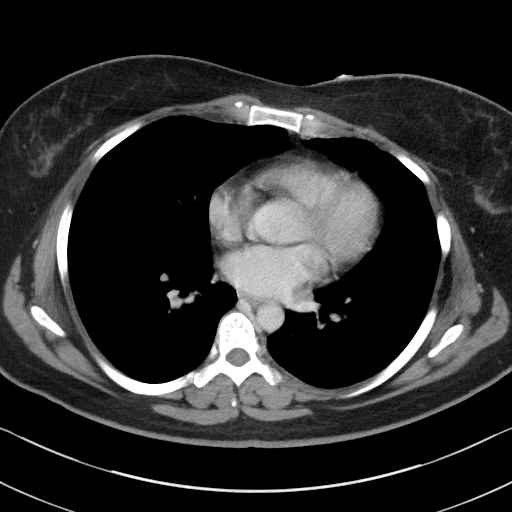

[Series 5: coronal st · coronal · 0.70mm/px · 3 of 100 slices shown]
[im 34/100  soft-tissue]
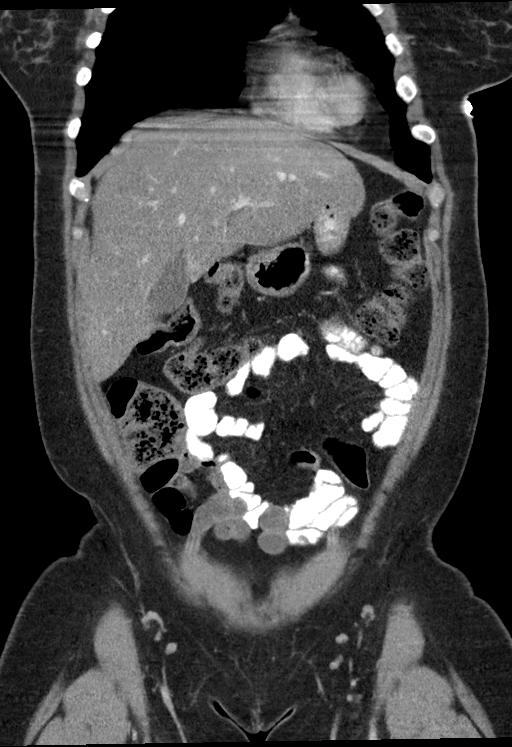
[im 45/100  soft-tissue]
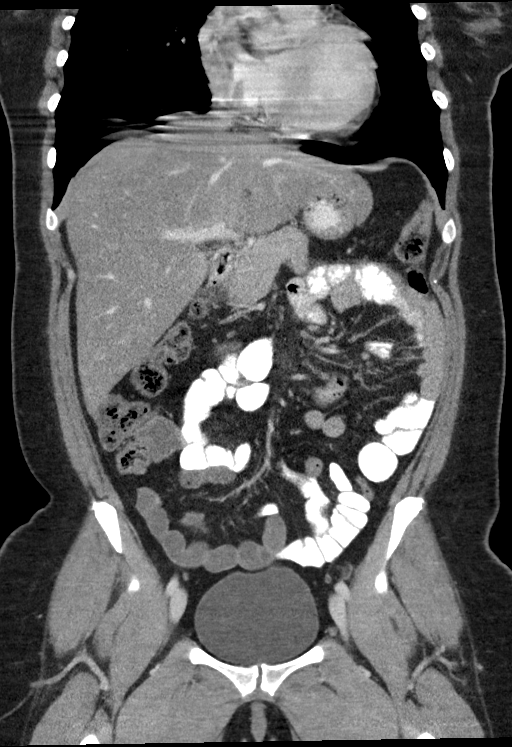
[im 56/100  soft-tissue]
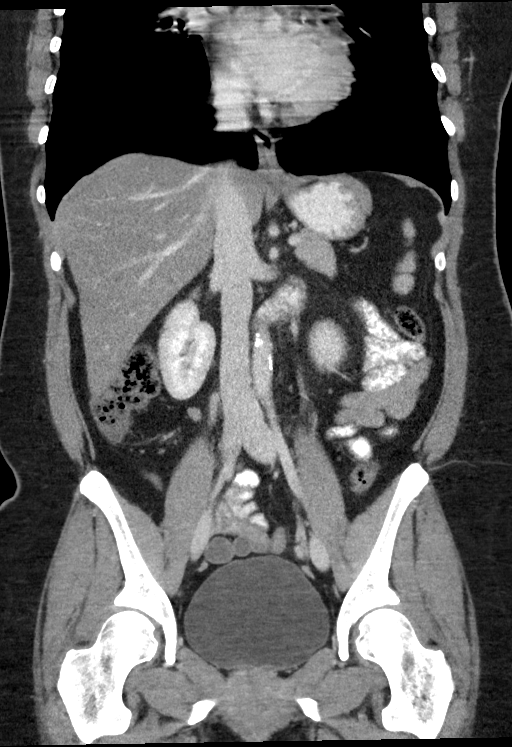

[14 of 46 positions shown; findings below may reference images not displayed]

FINDINGS: CTA CHEST FINDINGS

Cardiovascular: Thoracic aorta is well visualize with a normal
branching pattern. No significant atherosclerotic changes are seen.
The pulmonary artery shows no large central pulmonary embolus
although timing was not performed for embolus evaluation. No cardiac
enlargement is seen. No significant coronary calcifications are
noted.

Mediastinum/Nodes: The esophagus is within normal limits. No hilar
or mediastinal adenopathy is noted. The thoracic inlet is
unremarkable.

Lungs/Pleura: The lungs are well aerated bilaterally without focal
infiltrate or sizable effusion. No pneumothorax is seen. A few
scattered nodules are noted within the right middle lobe and left
lower lobe measuring less than 5 mm. No sizable nodule is
identified.

Musculoskeletal: No chest wall abnormality. No acute or significant
osseous findings.

Review of the MIP images confirms the above findings.

CT ABDOMEN and PELVIS FINDINGS

Hepatobiliary: Mild fatty infiltration of the liver is noted. The
gallbladder is within normal limits.

Pancreas: Unremarkable. No pancreatic ductal dilatation or
surrounding inflammatory changes.

Spleen: Normal in size without focal abnormality.

Adrenals/Urinary Tract: Adrenal glands are unremarkable. Kidneys are
normal, without renal calculi, focal lesion, or hydronephrosis.
Bladder is unremarkable.

Stomach/Bowel: Stomach is within normal limits. Appendix appears
normal. No evidence of bowel wall thickening, distention, or
inflammatory changes.

Vascular/Lymphatic: Aortic atherosclerosis. No enlarged abdominal or
pelvic lymph nodes.

Reproductive: IUD is noted in place. Uterus and adnexa are otherwise
within normal limits.

Other: No abdominal wall hernia or abnormality. No abdominopelvic
ascites.

Musculoskeletal: No acute or significant osseous findings.

Review of the MIP images confirms the above findings.
IMPRESSION: CTA of the chest: No evidence of aortic or pulmonary arterial
abnormality.

Multiple small less than 5 mm nodules within both lungs No follow-up
needed if patient is low-risk (and has no known or suspected primary
neoplasm). Non-contrast chest CT can be considered in 12 months if
patient is high-risk. This recommendation follows the consensus
statement: Guidelines for Management of Incidental Pulmonary Nodules
Detected on CT Images: From the [HOSPITAL] 0724; Radiology
0724; [DATE].

CT of the abdomen and pelvis: Fatty infiltration of the liver. No
acute abnormality noted.

## 2019-12-09 IMAGING — CT CT ANGIO CHEST
2 of 6 series · 17 of 36 positions shown · IV contrast (APPLIED)
Comparison: None.

CLINICAL DATA: Chest and abdominal pain for 2 weeks

EXAM:
CT ANGIOGRAPHY CHEST
CT ABDOMEN AND PELVIS WITH CONTRAST
TECHNIQUE: Multidetector CT imaging of the chest was performed using the
standard protocol during bolus administration of intravenous
contrast. Multiplanar CT image reconstructions and MIPs were
obtained to evaluate the vascular anatomy. Multidetector CT imaging
of the abdomen and pelvis was performed using the standard protocol
during bolus administration of intravenous contrast.
CONTRAST:  100mL OMNIPAQUE IOHEXOL 350 MG/ML SOLN

[Series 5: thins · axial · 0.69mm/px · z∈[-476,-238]mm · 16 of 266 slices shown]
[im 14/266  lung]
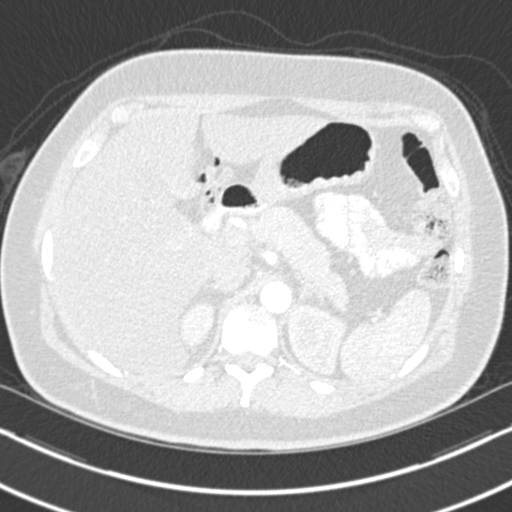
[im 27/266  mediastinal]
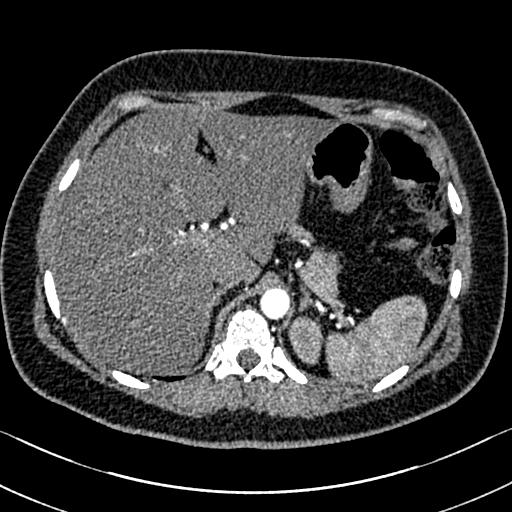
[im 40/266  lung]
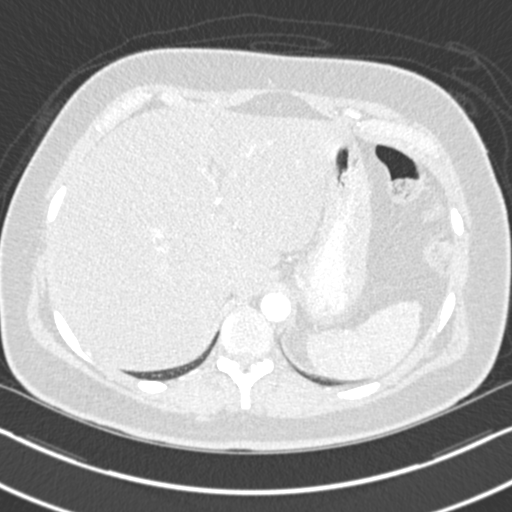
[im 67/266  mediastinal]
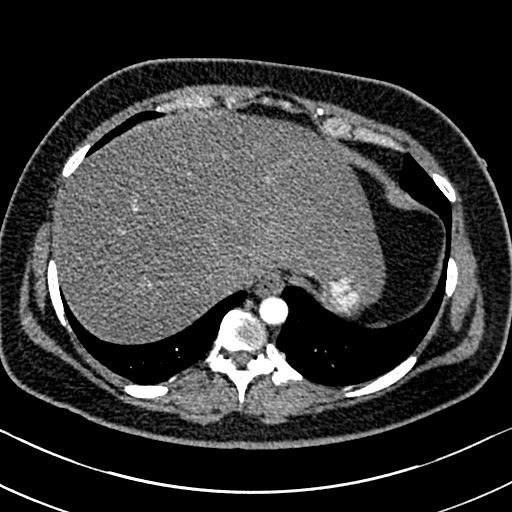
[im 80/266  lung]
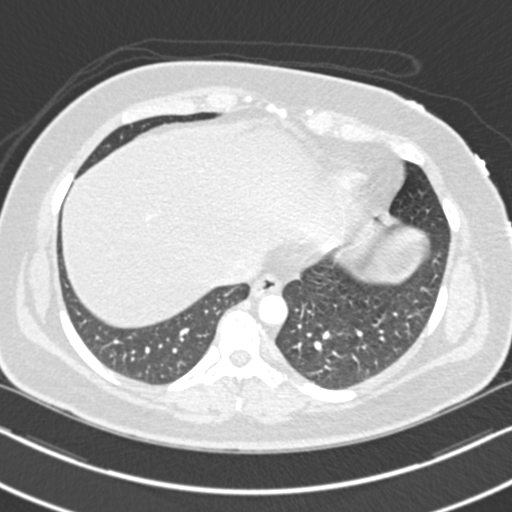
[im 93/266  mediastinal]
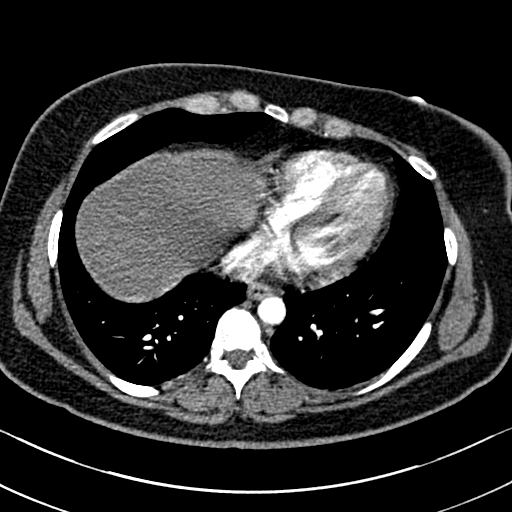
[im 107/266  lung]
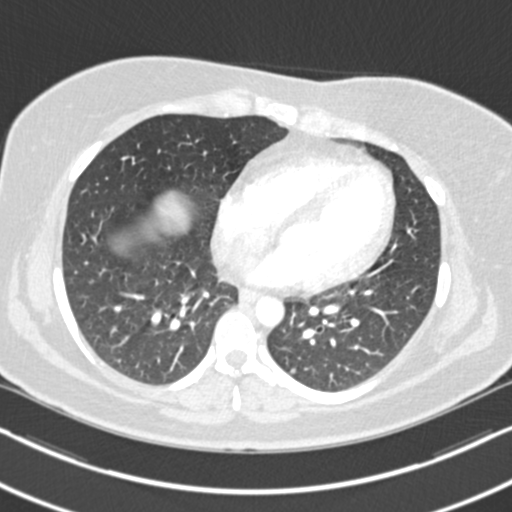
[im 120/266  mediastinal]
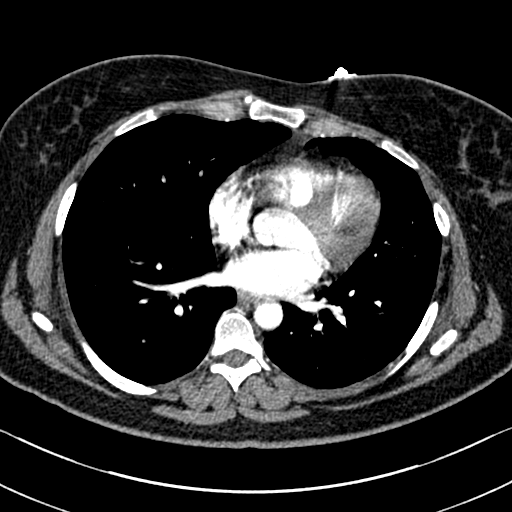
[im 146/266  lung]
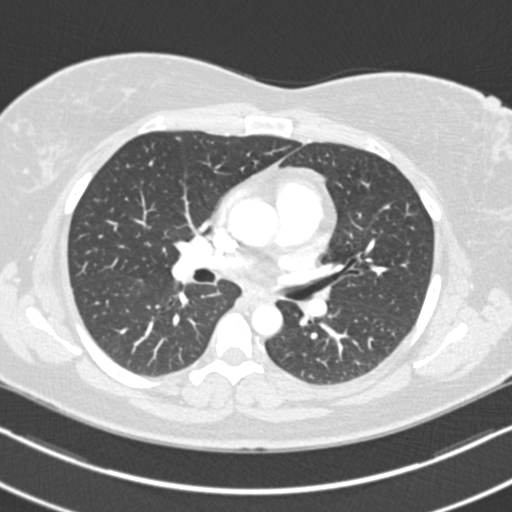
[im 160/266  mediastinal]
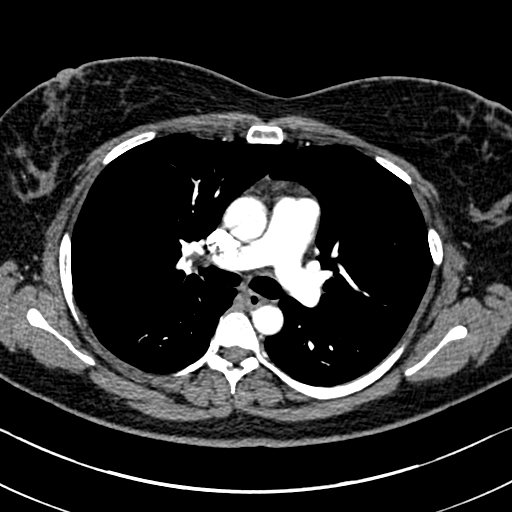
[im 173/266  lung]
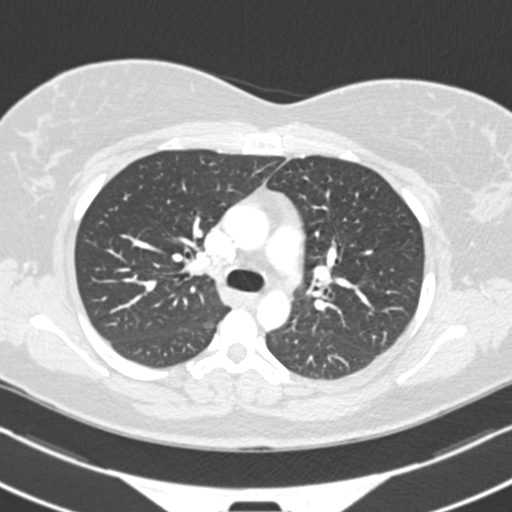
[im 186/266  mediastinal]
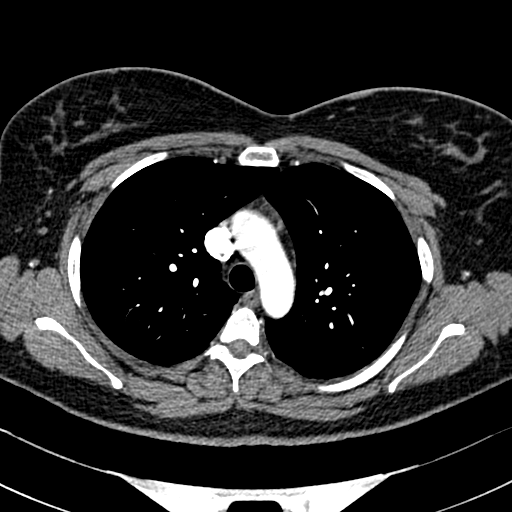
[im 199/266  lung]
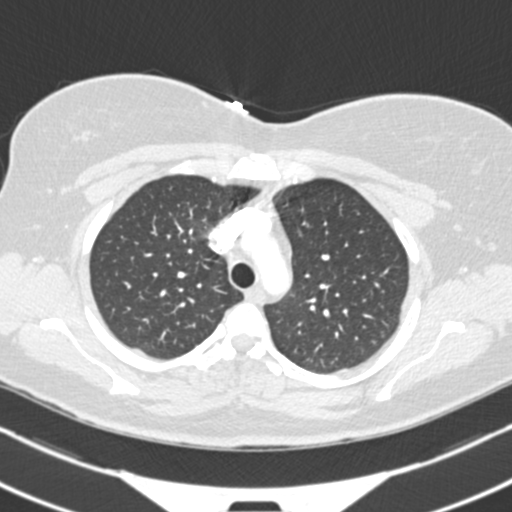
[im 226/266  mediastinal]
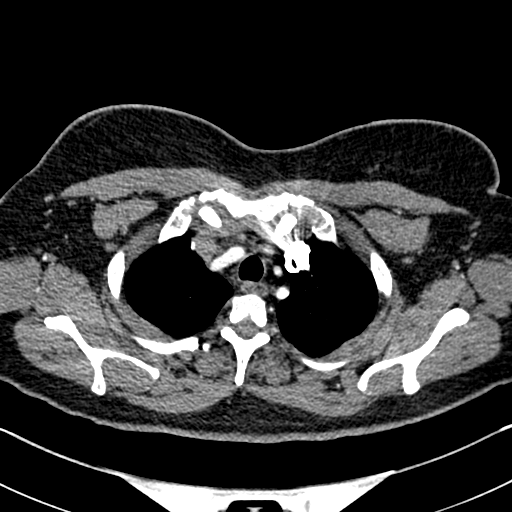
[im 239/266  lung]
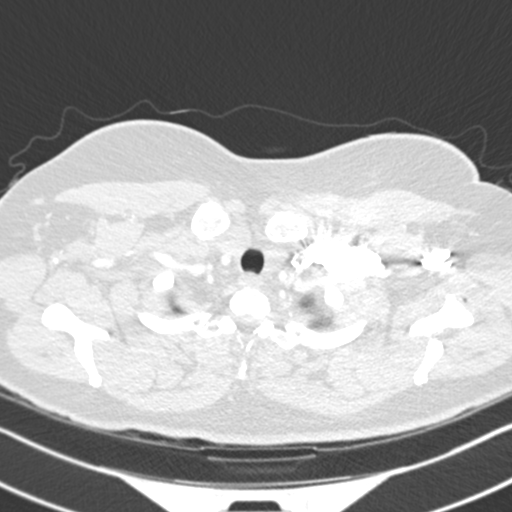
[im 252/266  mediastinal]
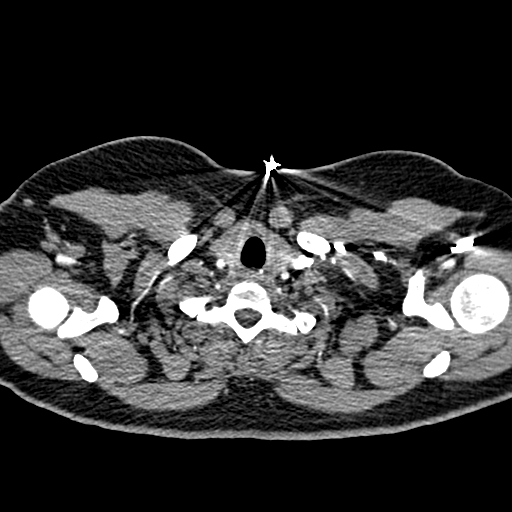

[Series 7: coronal mpr · coronal · 0.52mm/px · 1 of 86 slices shown]
[im 43/86  mediastinal]
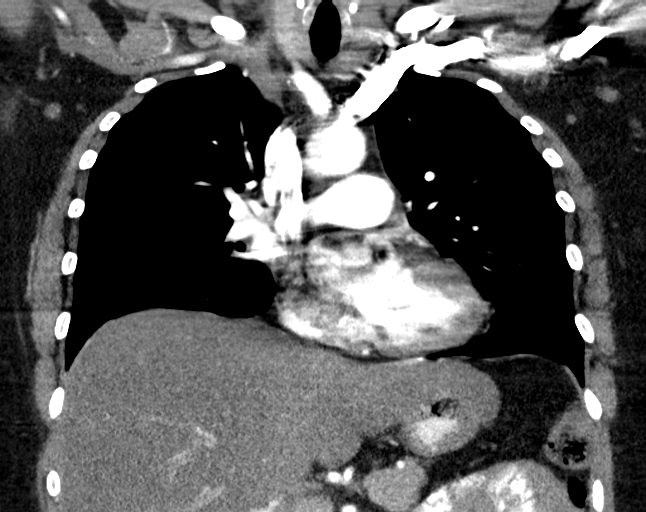

[17 of 36 positions shown; findings below may reference images not displayed]

FINDINGS: CTA CHEST FINDINGS

Cardiovascular: Thoracic aorta is well visualize with a normal
branching pattern. No significant atherosclerotic changes are seen.
The pulmonary artery shows no large central pulmonary embolus
although timing was not performed for embolus evaluation. No cardiac
enlargement is seen. No significant coronary calcifications are
noted.

Mediastinum/Nodes: The esophagus is within normal limits. No hilar
or mediastinal adenopathy is noted. The thoracic inlet is
unremarkable.

Lungs/Pleura: The lungs are well aerated bilaterally without focal
infiltrate or sizable effusion. No pneumothorax is seen. A few
scattered nodules are noted within the right middle lobe and left
lower lobe measuring less than 5 mm. No sizable nodule is
identified.

Musculoskeletal: No chest wall abnormality. No acute or significant
osseous findings.

Review of the MIP images confirms the above findings.

CT ABDOMEN and PELVIS FINDINGS

Hepatobiliary: Mild fatty infiltration of the liver is noted. The
gallbladder is within normal limits.

Pancreas: Unremarkable. No pancreatic ductal dilatation or
surrounding inflammatory changes.

Spleen: Normal in size without focal abnormality.

Adrenals/Urinary Tract: Adrenal glands are unremarkable. Kidneys are
normal, without renal calculi, focal lesion, or hydronephrosis.
Bladder is unremarkable.

Stomach/Bowel: Stomach is within normal limits. Appendix appears
normal. No evidence of bowel wall thickening, distention, or
inflammatory changes.

Vascular/Lymphatic: Aortic atherosclerosis. No enlarged abdominal or
pelvic lymph nodes.

Reproductive: IUD is noted in place. Uterus and adnexa are otherwise
within normal limits.

Other: No abdominal wall hernia or abnormality. No abdominopelvic
ascites.

Musculoskeletal: No acute or significant osseous findings.

Review of the MIP images confirms the above findings.
IMPRESSION: CTA of the chest: No evidence of aortic or pulmonary arterial
abnormality.

Multiple small less than 5 mm nodules within both lungs No follow-up
needed if patient is low-risk (and has no known or suspected primary
neoplasm). Non-contrast chest CT can be considered in 12 months if
patient is high-risk. This recommendation follows the consensus
statement: Guidelines for Management of Incidental Pulmonary Nodules
Detected on CT Images: From the [HOSPITAL] 0724; Radiology
0724; [DATE].

CT of the abdomen and pelvis: Fatty infiltration of the liver. No
acute abnormality noted.

## 2021-01-30 ENCOUNTER — Ambulatory Visit
Admission: EM | Admit: 2021-01-30 | Discharge: 2021-01-30 | Disposition: A | Discharge status: Home / Self Care | Payer: BC Managed Care – PPO | Attending: Physician Assistant | Admitting: Physician Assistant

## 2021-01-30 ENCOUNTER — Other Ambulatory Visit: Payer: Self-pay

## 2021-01-30 DIAGNOSIS — Z20822 Contact with and (suspected) exposure to covid-19: Secondary | ICD-10-CM | POA: Insufficient documentation

## 2021-01-30 DIAGNOSIS — J069 Acute upper respiratory infection, unspecified: Secondary | ICD-10-CM

## 2021-01-30 DIAGNOSIS — R059 Cough, unspecified: Secondary | ICD-10-CM | POA: Insufficient documentation

## 2021-01-30 LAB — RESP PANEL BY RT-PCR (FLU A&B, COVID) ARPGX2
Influenza A by PCR: NEGATIVE
Influenza B by PCR: NEGATIVE
SARS Coronavirus 2 by RT PCR: NEGATIVE

## 2021-01-30 MED ORDER — FLUTICASONE PROPIONATE 50 MCG/ACT NA SUSP: 2.0000 | Freq: Every day | NASAL | 0 refills | Status: AC

## 2021-01-30 MED ORDER — BENZONATATE 100 MG PO CAPS: 100.0000 mg | ORAL_CAPSULE | Freq: Three times a day (TID) | ORAL | 0 refills | Status: DC

## 2021-01-30 NOTE — ED Provider Notes (Signed)
[facility]    CSN: [id number] Arrival date & time: 01/30/21  0808      History   Chief Complaint Chief Complaint  Patient presents with   Cough    HPI Tiffany Cuevas is a 35 y.o. female.   HPI  Cough: Pt reports with a 5 day history of cough, nasal congestion and sinus headaches. She reports that symptoms are worsening as today she could not taste her coffee and is concerned that she may have COVID-19. She has tried dayquil for symptoms which has helped. She denies chest pain, SOB, known fevers. Various sick contacts.   Past Medical History:  Diagnosis Date   Migraines    Panic disorder    Vertigo     There are no problems to display for this patient.   Past Surgical History:  Procedure Laterality Date   OVARIAN CYST REMOVAL     WISDOM TOOTH EXTRACTION      OB History   No obstetric history on file.      Home Medications    Prior to Admission medications   Medication Sig Start Date End Date Taking? Authorizing Provider  clonazePAM (KLONOPIN) 0.5 MG tablet Take 0.5 mg by mouth 2 (two) times daily as needed for anxiety.   Yes [provider]  escitalopram (LEXAPRO) 10 MG tablet Take 10 mg by mouth daily.   Yes [provider]  hydrOXYzine (ATARAX/VISTARIL) 10 MG tablet Take 1 tablet (10 mg total) by mouth every 8 (eight) hours as needed for anxiety. 11/16/17  Yes Payton Mccallum, MD  propranolol (INDERAL) 10 MG tablet Take 10 mg by mouth 2 (two) times daily. 11/28/20  Yes [provider]  SUMAtriptan (IMITREX) 100 MG tablet  05/22/18  Yes [provider]  AJOVY 225 MG/1.5ML SOSY INJECT 225 MG SUBCUTANEOUSLY EVERY 28 (TWENTY-EIGHT) DAYS 05/17/18   [provider]  butalbital-acetaminophen-caffeine (FIORICET, ESGIC) 50-325-40 MG tablet Take 1 tablet by mouth 2 (two) times daily as needed for headache.    [provider]  ketorolac (TORADOL) 10 MG tablet Take 1 tablet (10 mg total) by mouth every 6 (six)  hours as needed for severe pain. 02/06/18   Merrily Brittle, MD  ondansetron (ZOFRAN ODT) 8 MG disintegrating tablet Take 1 tablet (8 mg total) by mouth every 8 (eight) hours as needed. 05/25/18   Payton Mccallum, MD  predniSONE (DELTASONE) 20 MG tablet Take 1 tablet (20 mg total) by mouth daily. 05/25/18   Payton Mccallum, MD  rizatriptan (MAXALT) 10 MG tablet Take 10 mg by mouth as needed for migraine. May repeat in 2 hours if needed    [provider]  venlafaxine XR (EFFEXOR-XR) 75 MG 24 hr capsule Take 75 mg by mouth daily with breakfast.    [provider]    Family History Family History  Problem Relation Age of Onset   Healthy Mother    Healthy Father     Social History Social History   Tobacco Use   Smoking status: Former   Smokeless tobacco: Never  Building services engineer Use: Never used  Substance Use Topics   Alcohol use: No   Drug use: Never     Allergies   Amoxicillin and Percocet [oxycodone-acetaminophen]   Review of Systems Review of Systems  As stated above in HPI Physical Exam Triage Vital Signs ED Triage Vitals  Enc Vitals Group     BP 01/30/21 0823 127/90     Pulse Rate 01/30/21 0823 Marland Kitchen)  110     Resp 01/30/21 0823 18     Temp 01/30/21 0823 99.1 F (37.3 C)     Temp Source 01/30/21 0823 Oral     SpO2 01/30/21 0823 96 %     Weight 01/30/21 0821 165 lb (74.8 kg)     Height 01/30/21 0821 5\' 3"  (1.6 m)     Head Circumference --      Peak Flow --      Pain Score 01/30/21 0820 7     Pain Loc --      Pain Edu? --      Excl. in GC? --    No data found.  Updated Vital Signs BP 127/90 (BP Location: Right Arm)   Pulse (!) 110   Temp 99.1 F (37.3 C) (Oral)   Resp 18   Ht 5\' 3"  (1.6 m)   Wt 165 lb (74.8 kg)   SpO2 96%   BMI 29.23 kg/m   Physical Exam Vitals and nursing note reviewed.  Constitutional:      General: She is not in acute distress.    Appearance: Normal appearance. She is not ill-appearing, toxic-appearing or  diaphoretic.  HENT:     Head: Normocephalic and atraumatic.     Right Ear: Tympanic membrane normal.     Left Ear: Tympanic membrane normal.     Nose: Congestion and rhinorrhea present.     Comments: No sinus bogginess     Mouth/Throat:     Mouth: Mucous membranes are moist.     Pharynx: No oropharyngeal exudate or posterior oropharyngeal erythema.  Eyes:     Extraocular Movements: Extraocular movements intact.     Pupils: Pupils are equal, round, and reactive to light.  Cardiovascular:     Rate and Rhythm: Normal rate and regular rhythm.     Heart sounds: Normal heart sounds.  Pulmonary:     Effort: Pulmonary effort is normal.     Breath sounds: Normal breath sounds.  Musculoskeletal:     Cervical back: Normal range of motion and neck supple.  Lymphadenopathy:     Cervical: No cervical adenopathy.  Skin:    General: Skin is warm.  Neurological:     Mental Status: She is alert and oriented to person, place, and time.     UC Treatments / Results  Labs (all labs ordered are listed, but only abnormal results are displayed) Labs Reviewed  RESP PANEL BY RT-PCR (FLU A&B, COVID) [id number]    EKG   Radiology No results found.  Procedures Procedures (including critical care time)  Medications Ordered in UC Medications - No data to display  Initial Impression / Assessment and Plan / UC Course  I have reviewed the triage vital signs and the nursing notes.  Pertinent labs & imaging results that were available during my care of the patient were reviewed by me and considered in my medical decision making (see chart for details).     New. Appears viral. Viral swab pending. UPDATE: Negative for COVID-19. Will treat with rest, hydration, flonase and tessalon. Follow up PRN as we discussed red flag signs and symptoms.  Final Clinical Impressions(s) / UC Diagnoses   Final diagnoses:  None   Discharge Instructions   None    ED Prescriptions   None    PDMP not reviewed  this encounter.   02/01/21, 01/30/21 [phone]

## 2021-01-30 NOTE — ED Triage Notes (Signed)
Pt here with C/O Chest and Head congestion, cough and headache, since Saturday.

## 2021-07-05 ENCOUNTER — Ambulatory Visit: Payer: Self-pay

## 2021-07-26 ENCOUNTER — Emergency Department
Admission: EM | Admit: 2021-07-26 | Discharge: 2021-07-26 | Disposition: A | Discharge status: Home / Self Care | Payer: BC Managed Care – PPO | Attending: Emergency Medicine | Admitting: Emergency Medicine

## 2021-07-26 ENCOUNTER — Encounter: Payer: Self-pay | Admitting: Emergency Medicine

## 2021-07-26 ENCOUNTER — Other Ambulatory Visit: Payer: Self-pay

## 2021-07-26 DIAGNOSIS — S4991XA Unspecified injury of right shoulder and upper arm, initial encounter: Secondary | ICD-10-CM | POA: Diagnosis present

## 2021-07-26 DIAGNOSIS — W540XXA Bitten by dog, initial encounter: Secondary | ICD-10-CM | POA: Diagnosis not present

## 2021-07-26 DIAGNOSIS — S41011A Laceration without foreign body of right shoulder, initial encounter: Secondary | ICD-10-CM | POA: Insufficient documentation

## 2021-07-26 MED ORDER — HYDROCODONE-ACETAMINOPHEN 5-325 MG PO TABS
1.0000 | ORAL_TABLET | Freq: Once | ORAL | Status: AC
  Administered 2021-07-26: 1 via ORAL
  Filled 2021-07-26: qty 1

## 2021-07-26 MED ORDER — DOXYCYCLINE HYCLATE 100 MG PO TABS
100.0000 mg | ORAL_TABLET | Freq: Once | ORAL | Status: AC
  Administered 2021-07-26: 100 mg via ORAL
  Filled 2021-07-26: qty 1

## 2021-07-26 MED ORDER — OXYCODONE HCL 5 MG PO TABS: 5.0000 mg | ORAL_TABLET | Freq: Three times a day (TID) | ORAL | 0 refills | Status: AC | PRN

## 2021-07-26 MED ORDER — METRONIDAZOLE 500 MG PO TABS
500.0000 mg | ORAL_TABLET | Freq: Once | ORAL | Status: AC
  Administered 2021-07-26: 500 mg via ORAL
  Filled 2021-07-26: qty 1

## 2021-07-26 MED ORDER — ONDANSETRON 4 MG PO TBDP: 4.0000 mg | ORAL_TABLET | Freq: Three times a day (TID) | ORAL | 0 refills | Status: DC | PRN

## 2021-07-26 MED ORDER — METRONIDAZOLE 500 MG PO TABS: 500.0000 mg | ORAL_TABLET | Freq: Three times a day (TID) | ORAL | 0 refills | Status: AC

## 2021-07-26 MED ORDER — DOXYCYCLINE HYCLATE 100 MG PO TABS: 100.0000 mg | ORAL_TABLET | Freq: Two times a day (BID) | ORAL | 0 refills | Status: AC

## 2021-07-26 MED ORDER — LIDOCAINE HCL (PF) 1 % IJ SOLN
5.0000 mL | Freq: Once | INTRAMUSCULAR | Status: AC
  Administered 2021-07-26: 5 mL
  Filled 2021-07-26: qty 5

## 2021-07-26 MED ORDER — ONDANSETRON 4 MG PO TBDP
4.0000 mg | ORAL_TABLET | Freq: Once | ORAL | Status: AC
  Administered 2021-07-26: 4 mg via ORAL
  Filled 2021-07-26: qty 1

## 2021-07-26 NOTE — ED Triage Notes (Signed)
Pt comes into the ED via POV c/o animal bite to the right forearm.  Pt states her dogs were playing and got too rough so she went to break them up and she got bit in the process.  Pt states her dogs are up to date on their rabies vaccines.  Pt has all bleeding under control at this time and is in NAD ?

## 2021-07-27 NOTE — ED Provider Notes (Signed)
? ? ?[facility] ?Emergency Department Provider Note ? ? ? ? Event Date/Time  ? First MD Initiated Contact with Patient 07/26/21 1431   ?  (approximate) ? ? ?History  ? ?Animal Bite ? ? ?HPI ? ?Tiffany Cuevas is a 36 y.o. female presents with the ED for evaluation of an accidental dog bite to her right upper arm.  Patient's dogs were playing and got too rough and she recently break them up.  She sustained a bite to the volar proximal forearm.  She denies any other injury at this time she reports her dog is up-to-date on rabies vaccine the patient is on her tetanus status. ? ? ?Physical Exam  ? ?Triage Vital Signs: ?ED Triage Vitals  ?Enc Vitals Group  ?   BP 07/26/21 1425 (!) 134/94  ?   Pulse Rate 07/26/21 1425 79  ?   Resp 07/26/21 1425 16  ?   Temp 07/26/21 1425 98.2 ?F (36.8 ?C)  ?   Temp Source 07/26/21 1425 Oral  ?   SpO2 07/26/21 1425 94 %  ?   Weight 07/26/21 1424 164 lb 14.5 oz (74.8 kg)  ?   Height 07/26/21 1424 5\' 3"  (1.6 m)  ?   Head Circumference --   ?   Peak Flow --   ?   Pain Score 07/26/21 1424 8  ?   Pain Loc --   ?   Pain Edu? --   ?   Excl. in GC? --   ? ? ?Most recent vital signs: ?Vitals:  ? 07/26/21 1425 07/26/21 1620  ?BP: (!) 134/94 130/88  ?Pulse: 79 80  ?Resp: 16 16  ?Temp: 98.2 ?F (36.8 ?C)   ?SpO2: 94% 95%  ? ? ?General Awake, no distress.  ?CV:  Good peripheral perfusion.  ?RESP:  Normal effort.  ?ABD:  No distention.  ?MSK:  Patient with a 1.5 cm laceration to the volar proximal forearm.  Surrounding abrasions are noted.  Subcu fat is visible in the wound bed.  Normal composite fist noted ? ? ?ED Results / Procedures / Treatments  ? ?Labs ?(all labs ordered are listed, but only abnormal results are displayed) ?Labs Reviewed - No data to display ? ? ?EKG ? ? ?RADIOLOGY ? ? ? ?No results found. ? ? ?PROCEDURES: ? ?Critical Care performed: No ? ?07/28/21.Laceration Repair ? ?Date/Time: 07/26/2021 3:20 PM ?Performed by: 07/28/2021, PA-C ?Authorized by: Lissa Hoard, PA-C  ? ?Consent:  ?  Consent obtained:  Verbal ?  Consent given by:  Patient ?  Risks, benefits, and alternatives were discussed: yes   ?  Risks discussed:  Infection, pain and poor wound healing ?Universal protocol:  ?  Site/side marked: yes   ?  Patient identity confirmed:  Verbally with patient ?Anesthesia:  ?  Anesthesia method:  Local infiltration ?  Local anesthetic:  Lidocaine 1% w/o epi ?Laceration details:  ?  Location:  Shoulder/arm ?  Shoulder/arm location:  R lower arm ?  Length (cm):  1.5 ?  Depth (mm):  5 ?Pre-procedure details:  ?  Preparation:  Patient was prepped and draped in usual sterile fashion ?Exploration:  ?  Limited defect created (wound extended): no   ?  Contaminated: no   ?Treatment:  ?  Area cleansed with:  Soap and water and saline ?  Amount of cleaning:  Extensive ?  Irrigation solution:  Sterile saline ?  Irrigation volume:  20 ?  Irrigation method:  Syringe ?  Visualized foreign bodies/material removed: yes   ?  Debridement:  None ?  Undermining:  None ?  Scar revision: no   ?Skin repair:  ?  Repair method:  Steri-Strips (Derma-Clips) ?  Number of Steri-Strips:  1 ?Approximation:  ?  Approximation:  Loose ?Repair type:  ?  Repair type:  Simple ?Post-procedure details:  ?  Dressing:  Non-adherent dressing ?  Procedure completion:  Tolerated well, no immediate complications ? ? ?MEDICATIONS ORDERED IN ED: ?Medications  ?ondansetron (ZOFRAN-ODT) disintegrating tablet 4 mg (4 mg Oral Given 07/26/21 1511)  ?HYDROcodone-acetaminophen (NORCO/VICODIN) 5-325 MG per tablet 1 tablet (1 tablet Oral Given 07/26/21 1511)  ?doxycycline (VIBRA-TABS) tablet 100 mg (100 mg Oral Given 07/26/21 1511)  ?metroNIDAZOLE (FLAGYL) tablet 500 mg (500 mg Oral Given 07/26/21 1511)  ?lidocaine (PF) (XYLOCAINE) 1 % injection 5 mL (5 mLs Infiltration Given by Other 07/26/21 1518)  ? ? ? ?IMPRESSION / MDM / ASSESSMENT AND PLAN / ED COURSE  ?I reviewed the triage vital signs and the nursing notes. ?              ?               ? ?Differential diagnosis includes, but is not limited to, skin tear, laceration, contusion ? ?Patient's diagnosis is consistent with dog bite. Patient will be discharged home with prescriptions for the doxycycline, metronidazole, Zofran, oxycodone. Patient is to follow up with primary provider as needed or otherwise directed. Patient is given ED precautions to return to the ED for any worsening or new symptoms. ? ? ?FINAL CLINICAL IMPRESSION(S) / ED DIAGNOSES  ? ?Final diagnoses:  ?Dog bite, initial encounter  ? ? ? ?Rx / DC Orders  ? ?ED Discharge Orders   ? ?      Ordered  ?  doxycycline (VIBRA-TABS) 100 MG tablet  2 times daily       ? 07/26/21 1508  ?  metroNIDAZOLE (FLAGYL) 500 MG tablet  3 times daily       ? 07/26/21 1508  ?  oxyCODONE (ROXICODONE) 5 MG immediate release tablet  Every 8 hours PRN       ? 07/26/21 1607  ?  ondansetron (ZOFRAN-ODT) 4 MG disintegrating tablet  Every 8 hours PRN       ? 07/26/21 1607  ? ?  ?  ? ?  ? ? ? ?Note:  This document was prepared using Dragon voice recognition software and may include unintentional dictation errors. ? ?  ?Lissa Hoard, PA-C ?07/27/21 1931 ? ?  ?Chesley Noon, MD ?07/30/21 1612 ? ?

## 2022-05-20 ENCOUNTER — Ambulatory Visit
Admission: EM | Admit: 2022-05-20 | Discharge: 2022-05-20 | Disposition: A | Discharge status: Home / Self Care | Payer: BC Managed Care – PPO | Attending: Emergency Medicine | Admitting: Emergency Medicine

## 2022-05-20 ENCOUNTER — Encounter: Payer: Self-pay | Admitting: Emergency Medicine

## 2022-05-20 DIAGNOSIS — Z1152 Encounter for screening for COVID-19: Secondary | ICD-10-CM | POA: Diagnosis not present

## 2022-05-20 DIAGNOSIS — R0602 Shortness of breath: Secondary | ICD-10-CM | POA: Insufficient documentation

## 2022-05-20 DIAGNOSIS — R058 Other specified cough: Secondary | ICD-10-CM | POA: Insufficient documentation

## 2022-05-20 DIAGNOSIS — J069 Acute upper respiratory infection, unspecified: Secondary | ICD-10-CM | POA: Insufficient documentation

## 2022-05-20 LAB — SARS CORONAVIRUS 2 BY RT PCR: SARS Coronavirus 2 by RT PCR: NEGATIVE

## 2022-05-20 LAB — RAPID INFLUENZA A&B ANTIGENS
Influenza A (ARMC): NEGATIVE
Influenza B (ARMC): NEGATIVE

## 2022-05-20 LAB — GROUP A STREP BY PCR: Group A Strep by PCR: NOT DETECTED

## 2022-05-20 MED ORDER — IPRATROPIUM BROMIDE 0.06 % NA SOLN: 2.0000 | Freq: Four times a day (QID) | NASAL | 12 refills | Status: DC

## 2022-05-20 MED ORDER — PROMETHAZINE-DM 6.25-15 MG/5ML PO SYRP: 5.0000 mL | ORAL_SOLUTION | Freq: Four times a day (QID) | ORAL | 0 refills | Status: DC | PRN

## 2022-05-20 MED ORDER — BENZONATATE 100 MG PO CAPS: 200.0000 mg | ORAL_CAPSULE | Freq: Three times a day (TID) | ORAL | 0 refills | Status: DC

## 2022-05-20 NOTE — ED Triage Notes (Signed)
Pt presents with a cough, fever, sore throat, chills x 2-3 days. She has been exposed to Covid and flu. Her at home covid test was negative.

## 2022-05-20 NOTE — ED Provider Notes (Signed)
[facility]    CSN: [id number] Arrival date & time: 05/20/22  0920      History   Chief Complaint Chief Complaint  Patient presents with   Cough    Flu symptoms - Entered by patient   Chills   Sore Throat   Fever    HPI Tiffany Cuevas is a 37 y.o. female.   HPI  37 year old female here for evaluation of respiratory complaints.  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  For the last 2 to 3 days she has been experiencing fever with a Tmax of 102 that was last night, chills, sore throat, cough.  She has had runny nose and nasal congestion with clear discharge.  Her cough has been nonproductive and she has had some intermittent shortness of breath but denies wheezing.  She also denies GI symptoms.  She did take a home COVID test that was negative.  Past Medical History:  Diagnosis Date   Migraines    Panic disorder    Vertigo     There are no problems to display for this patient.   Past Surgical History:  Procedure Laterality Date   OVARIAN CYST REMOVAL     WISDOM TOOTH EXTRACTION      OB History   No obstetric history on file.      Home Medications    Prior to Admission medications   Medication Sig Start Date End Date Taking? Authorizing Provider  benzonatate (TESSALON) 100 MG capsule Take 2 capsules (200 mg total) by mouth every 8 (eight) hours. 05/20/22  Yes Margarette Canada, NP  clonazePAM (KLONOPIN) 0.5 MG tablet Take 0.5 mg by mouth 2 (two) times daily as needed for anxiety.   Yes [provider]  escitalopram (LEXAPRO) 10 MG tablet Take 10 mg by mouth daily.   Yes [provider]  ipratropium (ATROVENT) 0.06 % nasal spray Place 2 sprays into both nostrils 4 (four) times daily. 05/20/22  Yes Margarette Canada, NP  promethazine-dextromethorphan (PROMETHAZINE-DM) 6.25-15 MG/5ML syrup Take 5 mLs by mouth 4 (four) times daily as needed. 05/20/22  Yes Margarette Canada, NP  propranolol  (INDERAL) 10 MG tablet Take 10 mg by mouth 2 (two) times daily. 11/28/20  Yes [provider]  SUMAtriptan (IMITREX) 100 MG tablet  05/22/18  Yes [provider]  AJOVY 225 MG/1.5ML SOSY INJECT 225 MG SUBCUTANEOUSLY EVERY 28 (TWENTY-EIGHT) DAYS 05/17/18   [provider]  butalbital-acetaminophen-caffeine (FIORICET, ESGIC) 50-325-40 MG tablet Take 1 tablet by mouth 2 (two) times daily as needed for headache.    [provider]  doxycycline (VIBRA-TABS) 100 MG tablet Take 1 tablet (100 mg total) by mouth 2 (two) times daily. 07/26/21   Menshew, Dannielle Karvonen, PA-C  fluticasone (FLONASE) 50 MCG/ACT nasal spray Place 2 sprays into both nostrils daily. 01/30/21   Hughie Closs, PA-C  hydrOXYzine (ATARAX/VISTARIL) 10 MG tablet Take 1 tablet (10 mg total) by mouth every 8 (eight) hours as needed for anxiety. 11/16/17   Norval Gable, MD  levonorgestrel (MIRENA) 20 MCG/DAY IUD by Intrauterine route.    [provider]  ondansetron (ZOFRAN-ODT) 4 MG disintegrating tablet Take 1 tablet (4 mg total) by mouth every 8 (eight) hours as needed for nausea or vomiting. 07/26/21   Menshew, Dannielle Karvonen, PA-C  predniSONE (DELTASONE) 20 MG tablet Take 1 tablet (20 mg total) by mouth daily. 05/25/18   Norval Gable, MD  rizatriptan (MAXALT) 10 MG tablet Take 10 mg by mouth as needed for migraine. May repeat in 2 hours if needed    [provider]  venlafaxine XR (EFFEXOR-XR) 75 MG 24 hr capsule Take 75 mg by mouth daily with breakfast.    [provider]    Family History Family History  Problem Relation Age of Onset   Healthy Mother    Healthy Father     Social History Social History   Tobacco Use   Smoking status: Former   Smokeless tobacco: Never  Scientific laboratory technician Use: Never used  Substance Use Topics   Alcohol use: No   Drug use: Never     Allergies   Amoxicillin and Percocet [oxycodone-acetaminophen]   Review of Systems Review  of Systems  Constitutional:  Positive for chills and fever.  HENT:  Positive for congestion, ear pain, rhinorrhea and sore throat.   Respiratory:  Positive for cough and shortness of breath. Negative for wheezing.   Gastrointestinal:  Negative for diarrhea, nausea and vomiting.  Skin:  Negative for rash.  Hematological: Negative.   Psychiatric/Behavioral: Negative.       Physical Exam Triage Vital Signs ED Triage Vitals  Enc Vitals Group     BP 05/20/22 0952 112/84     Pulse Rate 05/20/22 0952 70     Resp 05/20/22 0952 16     Temp 05/20/22 0952 99 F (37.2 C)     Temp src --      SpO2 05/20/22 0952 100 %     Weight --      Height --      Head Circumference --      Peak Flow --      Pain Score 05/20/22 0950 4     Pain Loc --      Pain Edu? --      Excl. in [place]? --    No data found.  Updated Vital Signs BP 112/84 (BP Location: Left Arm)   Pulse 70   Temp 99 F (37.2 C)   Resp 16   SpO2 100%   Visual Acuity Right Eye Distance:   Left Eye Distance:   Bilateral Distance:    Right Eye Near:   Left Eye Near:    Bilateral Near:     Physical Exam Vitals and nursing note reviewed.  Constitutional:      Appearance: Normal appearance. She is not ill-appearing.  HENT:     Head: Normocephalic and atraumatic.     Right Ear: Tympanic membrane, ear canal and external ear normal. There is no impacted cerumen.     Left Ear: Tympanic membrane, ear canal and external ear normal. There is no impacted cerumen.     Nose: Congestion and rhinorrhea present.     Comments: Haze Rushing is erythematous and markedly edematous with clear discharge in both nares.    Mouth/Throat:     Mouth: Mucous membranes are moist.     Pharynx: Oropharynx is clear. Posterior oropharyngeal erythema present. No oropharyngeal exudate.     Comments: Oropharyngeal exam reveals 1+ edematous tonsillar pillars with erythema bilaterally.  No injection or exudate.  Posterior pharynx demonstrates erythema and injection  with clear postnasal drip. Cardiovascular:     Rate and Rhythm: Normal rate and regular rhythm.     Pulses: Normal pulses.     Heart sounds: Normal heart sounds. No murmur heard.    No friction rub. No gallop.  Pulmonary:  Effort: Pulmonary effort is normal.     Breath sounds: Normal breath sounds. No wheezing, rhonchi or rales.  Musculoskeletal:     Cervical back: Normal range of motion and neck supple.  Lymphadenopathy:     Cervical: No cervical adenopathy.  Skin:    General: Skin is warm and dry.     Capillary Refill: Capillary refill takes less than 2 seconds.     Findings: No erythema.  Neurological:     General: No focal deficit present.     Mental Status: She is alert and oriented to person, place, and time.      UC Treatments / Results  Labs (all labs ordered are listed, but only abnormal results are displayed) Labs Reviewed  SARS CORONAVIRUS 2 BY RT PCR  RAPID INFLUENZA A&B ANTIGENS  GROUP A STREP BY PCR    EKG   Radiology No results found.  Procedures Procedures (including critical care time)  Medications Ordered in UC Medications - No data to display  Initial Impression / Assessment and Plan / UC Course  I have reviewed the triage vital signs and the nursing notes.  Pertinent labs & imaging results that were available during my care of the patient were reviewed by me and considered in my medical decision making (see chart for details).   Patient is a pleasant and nontoxic-appearing 37 year old female with a history of migraines, vertigo, and panic disorder presenting for evaluation of 2 days worth of respiratory symptoms as outlined in HPI above.  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  She took a home COVID test that was negative.  Her exam does reveal inflammation of her upper respiratory tree with clear nasal discharge and clear postnasal drip.  Cardiopulmonary exam is benign.  Given her COVID and flu exposure I  will order a COVID and flu PCR.  With erythema and edema of her tonsillar pillars I will order a strep PCR as well.  Strep PCR, COVID PCR, and flu antigen test were all negative.  I will discharge patient home with a diagnosis of viral URI with a cough and treat her with over-the-counter Tylenol and/or ibuprofen according to the package instructions for body aches and fever.  Atrovent nasal spray 4 times a day as needed for nasal congestion.  I will also prescribe Tessalon Perles and Promethazine DM cough syrup as needed for cough and congestion.  Return precautions reviewed.  Work note provided.   Final Clinical Impressions(s) / UC Diagnoses   Final diagnoses:  Viral URI with cough     Discharge Instructions      You have tested negative for COVID, Strep, and Flu. I do believe that you have a viral respiratory infection.  Take OTC Tylenol and/or Ibuprofen as needed for fever and pain.  Use the Atrovent nasal spray, 2 squirts in each nostril every 6 hours, as needed for runny nose and postnasal drip.  Use the Tessalon Perles every 8 hours during the day.  Take them with a small sip of water.  They may give you some numbness to the base of your tongue or a metallic taste in your mouth, this is normal.  Use the Promethazine DM cough syrup at bedtime for cough and congestion.  It will make you drowsy so do not take it during the day.  Return for reevaluation or see your primary care provider for any new or worsening symptoms.      ED Prescriptions  Medication Sig Dispense Auth. Provider   benzonatate (TESSALON) 100 MG capsule Take 2 capsules (200 mg total) by mouth every 8 (eight) hours. 21 capsule Margarette Canada, NP   ipratropium (ATROVENT) 0.06 % nasal spray Place 2 sprays into both nostrils 4 (four) times daily. 15 mL Margarette Canada, NP   promethazine-dextromethorphan (PROMETHAZINE-DM) 6.25-15 MG/5ML syrup Take 5 mLs by mouth 4 (four) times daily as needed. 118 mL Margarette Canada, NP       PDMP not reviewed this encounter.   Margarette Canada, NP 05/20/22 [phone]

## 2022-05-20 NOTE — Discharge Instructions (Signed)
You have tested negative for COVID, Strep, and Flu. I do believe that you have a viral respiratory infection.  Take OTC Tylenol and/or Ibuprofen as needed for fever and pain.  Use the Atrovent nasal spray, 2 squirts in each nostril every 6 hours, as needed for runny nose and postnasal drip.  Use the Tessalon Perles every 8 hours during the day.  Take them with a small sip of water.  They may give you some numbness to the base of your tongue or a metallic taste in your mouth, this is normal.  Use the Promethazine DM cough syrup at bedtime for cough and congestion.  It will make you drowsy so do not take it during the day.  Return for reevaluation or see your primary care provider for any new or worsening symptoms.

## 2022-06-20 ENCOUNTER — Ambulatory Visit
Admission: EM | Admit: 2022-06-20 | Discharge: 2022-06-20 | Disposition: A | Discharge status: Home / Self Care | Payer: BC Managed Care – PPO | Attending: Emergency Medicine | Admitting: Emergency Medicine

## 2022-06-20 ENCOUNTER — Other Ambulatory Visit: Payer: Self-pay

## 2022-06-20 DIAGNOSIS — Z20828 Contact with and (suspected) exposure to other viral communicable diseases: Secondary | ICD-10-CM | POA: Insufficient documentation

## 2022-06-20 DIAGNOSIS — R509 Fever, unspecified: Secondary | ICD-10-CM | POA: Diagnosis present

## 2022-06-20 DIAGNOSIS — J101 Influenza due to other identified influenza virus with other respiratory manifestations: Secondary | ICD-10-CM

## 2022-06-20 DIAGNOSIS — J029 Acute pharyngitis, unspecified: Secondary | ICD-10-CM | POA: Diagnosis present

## 2022-06-20 DIAGNOSIS — Z20822 Contact with and (suspected) exposure to covid-19: Secondary | ICD-10-CM | POA: Insufficient documentation

## 2022-06-20 LAB — GROUP A STREP BY PCR: Group A Strep by PCR: NOT DETECTED

## 2022-06-20 LAB — RESP PANEL BY RT-PCR (RSV, FLU A&B, COVID)  RVPGX2
Influenza A by PCR: NEGATIVE
Influenza B by PCR: POSITIVE — AB
Resp Syncytial Virus by PCR: NEGATIVE
SARS Coronavirus 2 by RT PCR: NEGATIVE

## 2022-06-20 MED ORDER — IPRATROPIUM BROMIDE 0.06 % NA SOLN: 2.0000 | Freq: Four times a day (QID) | NASAL | 12 refills | Status: AC

## 2022-06-20 MED ORDER — BENZONATATE 100 MG PO CAPS: 200.0000 mg | ORAL_CAPSULE | Freq: Three times a day (TID) | ORAL | 0 refills | Status: AC

## 2022-06-20 MED ORDER — PROMETHAZINE-DM 6.25-15 MG/5ML PO SYRP: 5.0000 mL | ORAL_SOLUTION | Freq: Four times a day (QID) | ORAL | 0 refills | Status: AC | PRN

## 2022-06-20 NOTE — ED Provider Notes (Signed)
[facility]    CSN: [id number] Arrival date & time: 06/20/22  0801      History   Chief Complaint Chief Complaint  Patient presents with   Sore Throat    Sore throat that started on Tuesday, fevers started on Wed. Symptoms continue to get worse now experiencing chills nausea and muscle aches.    HPI Tiffany Cuevas is a 37 y.o. female.   HPI  38 year old female here for evaluation of respiratory complaints.  The patient has a past medical history that is significant for vertigo, migraines, and panic disorder and she is presenting for evaluation of 3 days worth of respiratory symptoms that began with a tickle in her throat and a cough.  The following day she developed a fever with a Tmax of 103, runny nose and nasal congestion with clear nasal discharge, ear pressure, a cough that is both productive and nonproductive for clear sputum, chest tightness, nausea, and diarrhea.  She denies any shortness of breath, wheezing, or vomiting.  She states that she has been around children with similar symptoms and some of tested positive for flu and others positive for COVID.  Past Medical History:  Diagnosis Date   Migraines    Panic disorder    Vertigo     There are no problems to display for this patient.   Past Surgical History:  Procedure Laterality Date   OVARIAN CYST REMOVAL     WISDOM TOOTH EXTRACTION      OB History   No obstetric history on file.      Home Medications    Prior to Admission medications   Medication Sig Start Date End Date Taking? Authorizing Provider  benzonatate (TESSALON) 100 MG capsule Take 2 capsules (200 mg total) by mouth every 8 (eight) hours. 06/20/22  Yes Margarette Canada, NP  ipratropium (ATROVENT) 0.06 % nasal spray Place 2 sprays into both nostrils 4 (four) times daily. 06/20/22  Yes Margarette Canada, NP  promethazine-dextromethorphan (PROMETHAZINE-DM) 6.25-15 MG/5ML syrup Take 5 mLs by mouth 4 (four) times daily as needed. 06/20/22  Yes  Margarette Canada, NP  AJOVY 225 MG/1.5ML SOSY INJECT 225 MG SUBCUTANEOUSLY EVERY 28 (TWENTY-EIGHT) DAYS 05/17/18   [provider]  butalbital-acetaminophen-caffeine (FIORICET, ESGIC) 50-325-40 MG tablet Take 1 tablet by mouth 2 (two) times daily as needed for headache.    [provider]  clonazePAM (KLONOPIN) 0.5 MG tablet Take 0.5 mg by mouth 2 (two) times daily as needed for anxiety.    [provider]  doxycycline (VIBRA-TABS) 100 MG tablet Take 1 tablet (100 mg total) by mouth 2 (two) times daily. 07/26/21   Menshew, Dannielle Karvonen, PA-C  escitalopram (LEXAPRO) 10 MG tablet Take 10 mg by mouth daily.    [provider]  fluticasone (FLONASE) 50 MCG/ACT nasal spray Place 2 sprays into both nostrils daily. 01/30/21   Hughie Closs, PA-C  hydrOXYzine (ATARAX/VISTARIL) 10 MG tablet Take 1 tablet (10 mg total) by mouth every 8 (eight) hours as needed for anxiety. 11/16/17   Norval Gable, MD  levonorgestrel (MIRENA) 20 MCG/DAY IUD by Intrauterine route.    [provider]  ondansetron (ZOFRAN-ODT) 4 MG disintegrating tablet Take 1 tablet (4 mg total) by mouth every 8 (eight) hours as needed for nausea or vomiting. 07/26/21   Menshew, Dannielle Karvonen, PA-C  predniSONE (DELTASONE) 20 MG tablet Take 1 tablet (20 mg total) by mouth daily. 05/25/18   Norval Gable, MD  propranolol (INDERAL) 10 MG tablet  Take 10 mg by mouth 2 (two) times daily. 11/28/20   [provider]  rizatriptan (MAXALT) 10 MG tablet Take 10 mg by mouth as needed for migraine. May repeat in 2 hours if needed    [provider]  SUMAtriptan (IMITREX) 100 MG tablet  05/22/18   [provider]  venlafaxine XR (EFFEXOR-XR) 75 MG 24 hr capsule Take 75 mg by mouth daily with breakfast.    [provider]    Family History Family History  Problem Relation Age of Onset   Healthy Mother    Healthy Father     Social History Social History   Tobacco Use    Smoking status: Former   Smokeless tobacco: Never  Scientific laboratory technician Use: Never used  Substance Use Topics   Alcohol use: No   Drug use: Never     Allergies   Amoxicillin and Percocet [oxycodone-acetaminophen]   Review of Systems Review of Systems  Constitutional:  Positive for chills and fever.  HENT:  Positive for congestion, ear pain, rhinorrhea and sore throat.        Ear pressure  Respiratory:  Positive for cough and chest tightness. Negative for shortness of breath and wheezing.   Gastrointestinal:  Positive for diarrhea and nausea. Negative for vomiting.  Musculoskeletal:  Positive for arthralgias and myalgias.     Physical Exam Triage Vital Signs ED Triage Vitals  Enc Vitals Group     BP 06/20/22 0811 126/88     Pulse Rate 06/20/22 0811 (!) 118     Resp 06/20/22 0811 20     Temp 06/20/22 0811 99.3 F (37.4 C)     Temp Source 06/20/22 0811 Oral     SpO2 06/20/22 0811 97 %     Weight --      Height --      Head Circumference --      Peak Flow --      Pain Score 06/20/22 0810 8     Pain Loc --      Pain Edu? --      Excl. in [place]? --    No data found.  Updated Vital Signs BP 126/88   Pulse (!) 118   Temp 99.3 F (37.4 C) (Oral)   Resp 20   SpO2 97%   Visual Acuity Right Eye Distance:   Left Eye Distance:   Bilateral Distance:    Right Eye Near:   Left Eye Near:    Bilateral Near:     Physical Exam Vitals and nursing note reviewed.  Constitutional:      Appearance: Normal appearance. She is ill-appearing.  HENT:     Head: Normocephalic and atraumatic.     Right Ear: Tympanic membrane, ear canal and external ear normal. There is no impacted cerumen.     Left Ear: Tympanic membrane, ear canal and external ear normal. There is no impacted cerumen.     Ears:     Comments: Mild erythema around the rim of both tympanic membranes and on the stapes.  No injection, bulging, or effusion noted.    Nose: Congestion and rhinorrhea present.      Comments: Nasal mucosa is edematous and erythematous with clear discharge in both nares.    Mouth/Throat:     Mouth: Mucous membranes are moist.     Pharynx: Oropharynx is clear. Posterior oropharyngeal erythema present. No oropharyngeal exudate.     Comments: Tonsillar pillars are erythematous and 1+ edematous but free  of exudate.  Posterior oropharynx also demonstrates erythema and injection with clear postnasal drip. Cardiovascular:     Rate and Rhythm: Normal rate and regular rhythm.     Pulses: Normal pulses.     Heart sounds: Normal heart sounds. No murmur heard.    No friction rub. No gallop.  Pulmonary:     Effort: Pulmonary effort is normal.     Breath sounds: Normal breath sounds. No wheezing, rhonchi or rales.  Musculoskeletal:     Cervical back: Normal range of motion and neck supple.  Lymphadenopathy:     Cervical: No cervical adenopathy.  Skin:    General: Skin is warm and dry.     Capillary Refill: Capillary refill takes less than 2 seconds.     Findings: No rash.  Neurological:     General: No focal deficit present.     Mental Status: She is alert and oriented to person, place, and time.      UC Treatments / Results  Labs (all labs ordered are listed, but only abnormal results are displayed) Labs Reviewed  RESP PANEL BY RT-PCR (RSV, FLU A&B, COVID)  RVPGX2 - Abnormal; Notable for the following components:      Result Value   Influenza B by PCR POSITIVE (*)    All other components within normal limits  GROUP A STREP BY PCR    EKG   Radiology No results found.  Procedures Procedures (including critical care time)  Medications Ordered in UC Medications - No data to display  Initial Impression / Assessment and Plan / UC Course  I have reviewed the triage vital signs and the nursing notes.  Pertinent labs & imaging results that were available during my care of the patient were reviewed by me and considered in my medical decision making (see chart for  details).   Patient is a pleasant, nontoxic-appearing 37 year old female presenting for evaluation of respiratory symptoms that started 3 days ago and are associated with fever up to 103.  She has elevated temp in clinic of 99.3 but no overt fever.  She is mildly tachycardic at 118 with an increased respiratory rate of 20.  Her room air oxygen saturation 97%.  She does have hoarseness to her voice when she speaks but she is able to speak in full sentences without dyspnea or tachypnea.  Her upper respiratory tract is edematous on exam with clear rhinorrhea and postnasal drip.  Cardiopulmonary exam reveals clear lung sounds in all fields.  Given her exposure to flu and COVID I will order a PCR for both as well as strep PCR given her sore throat.  Strep PCR is negative.  Respiratory panel is positive for influenza B.  I will discharge patient with a diagnosis of influenza B and have her isolate until she is fever free for 24 hours.  She is outside the therapeutic window for Tamiflu.  I will prescribe Atrovent nasal spray to help with congestion, Tessalon Perles, and Promethazine DM cough Ceptava with cough and congestion.   Final Clinical Impressions(s) / UC Diagnoses   Final diagnoses:  Influenza B     Discharge Instructions      You have tested positive for influenza B today.  Unfortunately you are outside the therapeutic window for Tamiflu.  You do need to isolate until you have been fever free for 24 hours without taking Tylenol and/or ibuprofen.  Use over-the-counter Tylenol and/or ibuprofen according to the package instructions as needed for fever and body aches.  Use the Atrovent nasal spray, 2 squirts in each nostril every 6 hours, as needed for runny nose and postnasal drip.  Use the Tessalon Perles every 8 hours during the day.  Take them with a small sip of water.  They may give you some numbness to the base of your tongue or a metallic taste in your mouth, this is normal.  Use  the Promethazine DM cough syrup at bedtime for cough and congestion.  It will make you drowsy so do not take it during the day.  Return for reevaluation or see your primary care provider for any new or worsening symptoms.      ED Prescriptions     Medication Sig Dispense Auth. Provider   benzonatate (TESSALON) 100 MG capsule Take 2 capsules (200 mg total) by mouth every 8 (eight) hours. 21 capsule Margarette Canada, NP   ipratropium (ATROVENT) 0.06 % nasal spray Place 2 sprays into both nostrils 4 (four) times daily. 15 mL Margarette Canada, NP   promethazine-dextromethorphan (PROMETHAZINE-DM) 6.25-15 MG/5ML syrup Take 5 mLs by mouth 4 (four) times daily as needed. 118 mL Margarette Canada, NP      PDMP not reviewed this encounter.   Margarette Canada, NP 06/20/22 [phone]

## 2022-06-20 NOTE — Discharge Instructions (Signed)
You have tested positive for influenza B today.  Unfortunately you are outside the therapeutic window for Tamiflu.  You do need to isolate until you have been fever free for 24 hours without taking Tylenol and/or ibuprofen.  Use over-the-counter Tylenol and/or ibuprofen according to the package instructions as needed for fever and body aches.  Use the Atrovent nasal spray, 2 squirts in each nostril every 6 hours, as needed for runny nose and postnasal drip.  Use the Tessalon Perles every 8 hours during the day.  Take them with a small sip of water.  They may give you some numbness to the base of your tongue or a metallic taste in your mouth, this is normal.  Use the Promethazine DM cough syrup at bedtime for cough and congestion.  It will make you drowsy so do not take it during the day.  Return for reevaluation or see your primary care provider for any new or worsening symptoms.

## 2022-06-20 NOTE — ED Triage Notes (Signed)
Pt c/o sore throat that started on Tuesday and fevers that started ton Wed now experiencing body aches, nausea and chills.

## 2022-06-21 ENCOUNTER — Other Ambulatory Visit: Payer: Self-pay

## 2022-06-21 ENCOUNTER — Emergency Department
Admission: EM | Admit: 2022-06-21 | Discharge: 2022-06-21 | Disposition: A | Discharge status: Home / Self Care | Payer: BC Managed Care – PPO | Attending: Emergency Medicine | Admitting: Emergency Medicine

## 2022-06-21 DIAGNOSIS — Z8616 Personal history of COVID-19: Secondary | ICD-10-CM | POA: Insufficient documentation

## 2022-06-21 DIAGNOSIS — G43809 Other migraine, not intractable, without status migrainosus: Secondary | ICD-10-CM | POA: Diagnosis not present

## 2022-06-21 DIAGNOSIS — R519 Headache, unspecified: Secondary | ICD-10-CM | POA: Diagnosis present

## 2022-06-21 DIAGNOSIS — E871 Hypo-osmolality and hyponatremia: Secondary | ICD-10-CM | POA: Diagnosis not present

## 2022-06-21 LAB — COMPREHENSIVE METABOLIC PANEL
ALT: 27 U/L (ref 0–44)
AST: 25 U/L (ref 15–41)
Albumin: 3.9 g/dL (ref 3.5–5.0)
Alkaline Phosphatase: 49 U/L (ref 38–126)
Anion gap: 5 (ref 5–15)
BUN: 10 mg/dL (ref 6–20)
CO2: 20 mmol/L — ABNORMAL LOW (ref 22–32)
Calcium: 8.2 mg/dL — ABNORMAL LOW (ref 8.9–10.3)
Chloride: 106 mmol/L (ref 98–111)
Creatinine, Ser: 0.7 mg/dL (ref 0.44–1.00)
GFR, Estimated: >60 mL/min (ref >60)
Glucose, Bld: 98 mg/dL (ref 70–99)
Potassium: 4 mmol/L (ref 3.5–5.1)
Sodium: 131 mmol/L — ABNORMAL LOW (ref 135–145)
Total Bilirubin: 0.7 mg/dL (ref 0.3–1.2)
Total Protein: 6.8 g/dL (ref 6.5–8.1)

## 2022-06-21 LAB — CBC WITH DIFFERENTIAL/PLATELET
Abs Immature Granulocytes: 0.02 10*3/uL (ref 0.00–0.07)
Basophils Absolute: 0 10*3/uL (ref 0.0–0.1)
Basophils Relative: 0 %
Eosinophils Absolute: 0 10*3/uL (ref 0.0–0.5)
Eosinophils Relative: 0 %
HCT: 44.8 % (ref 36.0–46.0)
Hemoglobin: 14.9 g/dL (ref 12.0–15.0)
Immature Granulocytes: 0 %
Lymphocytes Relative: 23 %
Lymphs Abs: 1.6 10*3/uL (ref 0.7–4.0)
MCH: 29.9 pg (ref 26.0–34.0)
MCHC: 33.3 g/dL (ref 30.0–36.0)
MCV: 90 fL (ref 80.0–100.0)
Monocytes Absolute: 0.6 10*3/uL (ref 0.1–1.0)
Monocytes Relative: 9 %
Neutro Abs: 4.8 10*3/uL (ref 1.7–7.7)
Neutrophils Relative %: 68 %
Platelets: 201 10*3/uL (ref 150–400)
RBC: 4.98 MIL/uL (ref 3.87–5.11)
RDW: 12.7 % (ref 11.5–15.5)
WBC: 7.1 10*3/uL (ref 4.0–10.5)
nRBC: 0 % (ref 0.0–0.2)

## 2022-06-21 LAB — LIPASE, BLOOD: Lipase: 30 U/L (ref 11–51)

## 2022-06-21 MED ORDER — KETOROLAC TROMETHAMINE 15 MG/ML IJ SOLN
15.0000 mg | Freq: Once | INTRAMUSCULAR | Status: AC
  Administered 2022-06-21: 15 mg via INTRAVENOUS
  Filled 2022-06-21: qty 1

## 2022-06-21 MED ORDER — METOCLOPRAMIDE HCL 5 MG/ML IJ SOLN
10.0000 mg | Freq: Once | INTRAMUSCULAR | Status: AC
  Administered 2022-06-21: 10 mg via INTRAVENOUS
  Filled 2022-06-21: qty 2

## 2022-06-21 MED ORDER — SODIUM CHLORIDE 0.9 % IV BOLUS
1000.0000 mL | Freq: Once | INTRAVENOUS | Status: AC
  Administered 2022-06-21: 1000 mL via INTRAVENOUS

## 2022-06-21 MED ORDER — ONDANSETRON 4 MG PO TBDP: 4.0000 mg | ORAL_TABLET | Freq: Three times a day (TID) | ORAL | 0 refills | Status: AC | PRN

## 2022-06-21 MED ORDER — DIPHENHYDRAMINE HCL 50 MG/ML IJ SOLN
50.0000 mg | Freq: Once | INTRAMUSCULAR | Status: AC
  Administered 2022-06-21: 50 mg via INTRAVENOUS
  Filled 2022-06-21: qty 1

## 2022-06-21 NOTE — Discharge Instructions (Addendum)
You can take Zofran every eight hours for nausea and vomiting.  

## 2022-06-21 NOTE — ED Notes (Signed)
ED Provider at bedside. 

## 2022-06-21 NOTE — ED Provider Notes (Signed)
[facility] Provider Note  Patient Contact: 8:21 PM (approximate)   History   Headache (Flu B positive and is here today for 10/10 headache that is not relieved with her home medication of Imitrex)   HPI  Tiffany Cuevas is a 37 y.o. female presents to the emergency department with a migraine that is typical for her.  Patient reports that she recently tested positive for both COVID and flu and she feels as though viral infection has caused her migraine prophy.  She states that she took her Imitrex but migraine continued.  No numbness or tingling in the upper and lower extremities.  No chest pain, chest tightness or abdominal pain.      Physical Exam   Triage Vital Signs: ED Triage Vitals  Enc Vitals Group     BP 06/21/22 1826 127/88     Pulse Rate 06/21/22 1826 (!) 107     Resp 06/21/22 1826 18     Temp 06/21/22 1826 98.2 F (36.8 C)     Temp Source 06/21/22 1826 Oral     SpO2 06/21/22 1826 98 %     Weight 06/21/22 1825 175 lb (79.4 kg)     Height 06/21/22 1825 '5\' 3"'$  (1.6 m)     Head Circumference --      Peak Flow --      Pain Score 06/21/22 1825 10     Pain Loc --      Pain Edu? --      Excl. in [place]? --     Most recent vital signs: Vitals:   06/21/22 1826  BP: 127/88  Pulse: (!) 107  Resp: 18  Temp: 98.2 F (36.8 C)  SpO2: 98%    Constitutional: Alert and oriented. Patient is lying supine. Eyes: Conjunctivae are normal. PERRL. EOMI. Head: Atraumatic. ENT:      Ears: Tympanic membranes are mildly injected with mild effusion bilaterally.       Nose: No congestion/rhinnorhea.      Mouth/Throat: Mucous membranes are moist. Posterior pharynx is mildly erythematous.  Hematological/Lymphatic/Immunilogical: No cervical lymphadenopathy.  Cardiovascular: Normal rate, regular rhythm. Normal S1 and S2.  Good peripheral circulation. Respiratory: Normal respiratory effort without tachypnea or retractions. Lungs CTAB. Good air entry to the bases  with no decreased or absent breath sounds. Gastrointestinal: Bowel sounds 4 quadrants. Soft and nontender to palpation. No guarding or rigidity. No palpable masses. No distention. No CVA tenderness. Musculoskeletal: Full range of motion to all extremities. No gross deformities appreciated. Neurologic:  Normal speech and language. No gross focal neurologic deficits are appreciated.  Skin:  Skin is warm, dry and intact. No rash noted. Psychiatric: Mood and affect are normal. Speech and behavior are normal. Patient exhibits appropriate insight and judgement.    ED Results / Procedures / Treatments   Labs (all labs ordered are listed, but only abnormal results are displayed) Labs Reviewed  COMPREHENSIVE METABOLIC PANEL - Abnormal; Notable for the following components:      Result Value   Sodium 131 (*)    CO2 20 (*)    Calcium 8.2 (*)    All other components within normal limits  CBC WITH DIFFERENTIAL/PLATELET  LIPASE, BLOOD  PREGNANCY, URINE      PROCEDURES:  Critical Care performed: No  Procedures   MEDICATIONS ORDERED IN ED: Medications  ketorolac (TORADOL) 15 MG/ML injection 15 mg (has no administration in time range)  sodium chloride 0.9 % bolus 1,000 mL (0 mLs Intravenous Stopped  06/21/22 2020)  diphenhydrAMINE (BENADRYL) injection 50 mg (50 mg Intravenous Given 06/21/22 1921)  metoCLOPramide (REGLAN) injection 10 mg (10 mg Intravenous Given 06/21/22 1921)     IMPRESSION / MDM / ASSESSMENT AND PLAN / ED COURSE  I reviewed the triage vital signs and the nursing notes.                              Assessment and plan:  Migraine:  37 year old female presents to the emergency department with a migraine.  Patient was mildly tachycardic at triage but vital signs were otherwise reassuring.  On exam, patient was alert, active and nontoxic-appearing with no neurodeficits noted.  Patient did have mild hyponatremia on CMP but labs are otherwise unremarkable.  Patient received  Reglan, Benadryl, normal saline bolus and IV Toradol and reported that her symptoms mostly resolved.  She feels comfortable being discharged.  Patient was prescribed Zofran for home use.      FINAL CLINICAL IMPRESSION(S) / ED DIAGNOSES   Final diagnoses:  Other migraine without status migrainosus, not intractable     Rx / DC Orders   ED Discharge Orders          Ordered    ondansetron (ZOFRAN-ODT) 4 MG disintegrating tablet  Every 8 hours PRN        06/21/22 2018             Note:  This document was prepared using Dragon voice recognition software and may include unintentional dictation errors.   Vallarie Mare [place], PA-C 06/21/22 2025    Harvest Dark, MD 06/25/22 1357

## 2022-06-21 NOTE — ED Triage Notes (Signed)
Flu B positive and is here today for 10/10 headache that is not relieved with her home medication of Imitrex

## 2022-06-30 ENCOUNTER — Other Ambulatory Visit: Payer: Self-pay | Admitting: Physician Assistant

## 2022-06-30 DIAGNOSIS — R519 Headache, unspecified: Secondary | ICD-10-CM

## 2022-06-30 DIAGNOSIS — H539 Unspecified visual disturbance: Secondary | ICD-10-CM

## 2022-08-29 ENCOUNTER — Other Ambulatory Visit: Payer: Self-pay

## 2022-08-29 DIAGNOSIS — Z021 Encounter for pre-employment examination: Secondary | ICD-10-CM

## 2022-08-29 NOTE — Progress Notes (Signed)
Presents to [employer] for onsite pre-employment drug screen.  New Hire for recereation PT [employer] for [employer].  [facility] Acct #:  [id number] [facility] Specimen #:  [id number]  Rapid drug screen results = Negative  AMD

## 2022-08-29 NOTE — Progress Notes (Signed)
Presents to [employer] for on-site pre-employment drug screen.  [facility] Acct #:  [id number] [facility] Specimen #:  [id number]  Rapid drug screen results = Negative  AMD

## 2023-02-26 ENCOUNTER — Emergency Department
Admission: EM | Admit: 2023-02-26 | Discharge: 2023-02-26 | Disposition: A | Discharge status: Home / Self Care | Payer: BC Managed Care – PPO | Attending: Emergency Medicine | Admitting: Emergency Medicine

## 2023-02-26 ENCOUNTER — Emergency Department: Payer: BC Managed Care – PPO

## 2023-02-26 ENCOUNTER — Other Ambulatory Visit: Payer: Self-pay

## 2023-02-26 ENCOUNTER — Encounter: Payer: Self-pay | Admitting: Intensive Care

## 2023-02-26 DIAGNOSIS — H539 Unspecified visual disturbance: Secondary | ICD-10-CM

## 2023-02-26 DIAGNOSIS — H538 Other visual disturbances: Secondary | ICD-10-CM | POA: Diagnosis present

## 2023-02-26 DIAGNOSIS — H53121 Transient visual loss, right eye: Secondary | ICD-10-CM | POA: Insufficient documentation

## 2023-02-26 DIAGNOSIS — Y900 Blood alcohol level of less than 20 mg/100 ml: Secondary | ICD-10-CM | POA: Insufficient documentation

## 2023-02-26 DIAGNOSIS — H53129 Transient visual loss, unspecified eye: Secondary | ICD-10-CM

## 2023-02-26 HISTORY — DX: Anxiety disorder, unspecified: F41.9

## 2023-02-26 LAB — CBC
HCT: 45.2 % (ref 36.0–46.0)
Hemoglobin: 15.7 g/dL — ABNORMAL HIGH (ref 12.0–15.0)
MCH: 30.7 pg (ref 26.0–34.0)
MCHC: 34.7 g/dL (ref 30.0–36.0)
MCV: 88.3 fL (ref 80.0–100.0)
Platelets: 345 10*3/uL (ref 150–400)
RBC: 5.12 MIL/uL — ABNORMAL HIGH (ref 3.87–5.11)
RDW: 12.1 % (ref 11.5–15.5)
WBC: 11.4 10*3/uL — ABNORMAL HIGH (ref 4.0–10.5)
nRBC: 0 % (ref 0.0–0.2)

## 2023-02-26 LAB — DIFFERENTIAL
Abs Immature Granulocytes: 0.04 10*3/uL (ref 0.00–0.07)
Basophils Absolute: 0 10*3/uL (ref 0.0–0.1)
Basophils Relative: 0 %
Eosinophils Absolute: 0 10*3/uL (ref 0.0–0.5)
Eosinophils Relative: 0 %
Immature Granulocytes: 0 %
Lymphocytes Relative: 26 %
Lymphs Abs: 2.9 10*3/uL (ref 0.7–4.0)
Monocytes Absolute: 0.6 10*3/uL (ref 0.1–1.0)
Monocytes Relative: 5 %
Neutro Abs: 7.8 10*3/uL — ABNORMAL HIGH (ref 1.7–7.7)
Neutrophils Relative %: 69 %

## 2023-02-26 LAB — ETHANOL: Alcohol, Ethyl (B): <10 mg/dL (ref <10)

## 2023-02-26 LAB — APTT: aPTT: 29 s (ref 24–36)

## 2023-02-26 LAB — PROTIME-INR
INR: 1 (ref 0.8–1.2)
Prothrombin Time: 13 s (ref 11.4–15.2)

## 2023-02-26 MED ORDER — KETOROLAC TROMETHAMINE 30 MG/ML IJ SOLN
30.0000 mg | Freq: Once | INTRAMUSCULAR | Status: AC
  Administered 2023-02-26: 30 mg via INTRAMUSCULAR
  Filled 2023-02-26: qty 1

## 2023-02-26 NOTE — ED Triage Notes (Signed)
Patient reports yesterday has loss of vision in right eye and then when vision started to return it was spots/sparkly. Also reports vision is not normal in right eye currently. Currently has headache. Reports taking Imitrex and headache still present.  Reports migraines since 37 years old but this one will not go away.

## 2023-02-26 NOTE — Discharge Instructions (Signed)
Please be sure to follow up with your neurologist and ophthalmologist. If you develop any worsening vision changes, change in speech, weakness or numbness in extremities or any other new or concerning symptoms please seek medical attention.

## 2023-02-26 NOTE — ED Notes (Signed)
See triage notes. Patient c/o vision loss in her right eye yesterday. Vision has started to return but is spotty. Patient c/o headache as well.

## 2023-02-26 NOTE — ED Provider Notes (Signed)
[facility] Provider Note    Event Date/Time   First MD Initiated Contact with Patient 02/26/23 1555     (approximate)   History   Loss of Vision   HPI  Tiffany Cuevas is a 37 y.o. female who presents to the emergency department today because of concerns for change of vision.  The patient states that symptoms started yesterday.  She was driving her car when all of a sudden she lost vision in her right eye.  Shortly thereafter she started developing a severe migraine headache.  Patient does have a history of migraines and has had vision disturbances with them in the past although it is fairly rare.  Today the patient states she still has a mild headache although has continued to have vision changes.  However now she is feeling more kaleidoscope type visual disturbance around the periphery of her vision.     Physical Exam   Triage Vital Signs: ED Triage Vitals  Encounter Vitals Group     BP 02/26/23 1235 (!) 134/93     Systolic BP Percentile --      Diastolic BP Percentile --      Pulse Rate 02/26/23 1235 84     Resp 02/26/23 1235 16     Temp 02/26/23 1235 98 F (36.7 C)     Temp Source 02/26/23 1235 Oral     SpO2 02/26/23 1235 100 %     Weight 02/26/23 1238 175 lb (79.4 kg)     Height 02/26/23 1238 5\' 3"  (1.6 m)     Head Circumference --      Peak Flow --      Pain Score 02/26/23 1238 4     Pain Loc --      Pain Education --      Exclude from Growth Chart --     Most recent vital signs: Vitals:   02/26/23 1235 02/26/23 1535  BP: (!) 134/93 113/80  Pulse: 84 74  Resp: 16 17  Temp: 98 F (36.7 C) 98.2 F (36.8 C)  SpO2: 100% 97%   General: Awake, alert, oriented. CV:  Good peripheral perfusion. Regular rate and rhythm. Resp:  Normal effort. Lungs clear. Abd:  No distention.  Other:  PERRL. EOMI. Face symmetric. Strength 5/5 in all extremities, sensation grossly intact.    ED Results / Procedures / Treatments   Labs (all labs  ordered are listed, but only abnormal results are displayed) Labs Reviewed  CBC - Abnormal; Notable for the following components:      Result Value   WBC 11.4 (*)    RBC 5.12 (*)    Hemoglobin 15.7 (*)    All other components within normal limits  DIFFERENTIAL - Abnormal; Notable for the following components:   Neutro Abs 7.8 (*)    All other components within normal limits  PROTIME-INR  APTT  ETHANOL  COMPREHENSIVE METABOLIC PANEL  POC URINE PREG, ED     EKG  None   RADIOLOGY I independently interpreted and visualized the CT head. My interpretation: No bleed Radiology interpretation:  IMPRESSION:  No evidence of acute intracranial abnormality.      PROCEDURES:  Critical Care performed: No   MEDICATIONS ORDERED IN ED: Medications - No data to display   IMPRESSION / MDM / ASSESSMENT AND PLAN / ED COURSE  I reviewed the triage vital signs and the nursing notes.  Differential diagnosis includes, but is not limited to, migraine, CVA, intraocular pathology  Patient's presentation is most consistent with acute presentation with potential threat to life or bodily function.  Patient presents to the emergency department today because of concerns for vision change.  Patient has his head without concerning abnormality.  This time I do think patient's vision change is likely secondary to migraine.  I have low concern for CVA.  Will give medication to see if we can help with the patient's symptoms.  She did request that we stay away from any sedating medications.  Patient without any significant relief with medication. However did feel comfortable with discharge home at this time which I think is reasonable. States she will contact her neurologist, has appointment with ophthalmologist tomorrow.       FINAL CLINICAL IMPRESSION(S) / ED DIAGNOSES   Final diagnoses:  Vision changes  Scintillating scotoma      Note:  This document was  prepared using Dragon voice recognition software and may include unintentional dictation errors.    Phineas Semen, MD 02/26/23 [phone]

## 2023-05-22 ENCOUNTER — Other Ambulatory Visit: Payer: Self-pay | Admitting: Physician Assistant

## 2023-05-22 DIAGNOSIS — R519 Headache, unspecified: Secondary | ICD-10-CM

## 2023-05-22 DIAGNOSIS — H539 Unspecified visual disturbance: Secondary | ICD-10-CM

## 2024-04-09 ENCOUNTER — Other Ambulatory Visit: Payer: Self-pay

## 2024-04-09 ENCOUNTER — Emergency Department
Admission: EM | Admit: 2024-04-09 | Discharge: 2024-04-10 | Disposition: A | Attending: Emergency Medicine | Admitting: Emergency Medicine

## 2024-04-09 DIAGNOSIS — N3000 Acute cystitis without hematuria: Secondary | ICD-10-CM | POA: Diagnosis not present

## 2024-04-09 DIAGNOSIS — N390 Urinary tract infection, site not specified: Secondary | ICD-10-CM

## 2024-04-09 DIAGNOSIS — R10A1 Flank pain, right side: Secondary | ICD-10-CM | POA: Diagnosis present

## 2024-04-09 LAB — LIPASE, BLOOD: Lipase: 18 U/L (ref 11–51)

## 2024-04-09 LAB — COMPREHENSIVE METABOLIC PANEL WITH GFR
ALT: 32 U/L (ref 0–44)
AST: 25 U/L (ref 15–41)
Albumin: 4.7 g/dL (ref 3.5–5.0)
Alkaline Phosphatase: 69 U/L (ref 38–126)
Anion gap: 15 (ref 5–15)
BUN: 14 mg/dL (ref 6–20)
CO2: 19 mmol/L — ABNORMAL LOW (ref 22–32)
Calcium: 9.5 mg/dL (ref 8.9–10.3)
Chloride: 101 mmol/L (ref 98–111)
Creatinine, Ser: 0.65 mg/dL (ref 0.44–1.00)
GFR, Estimated: >60 mL/min
Glucose, Bld: 102 mg/dL — ABNORMAL HIGH (ref 70–99)
Potassium: 4.2 mmol/L (ref 3.5–5.1)
Sodium: 136 mmol/L (ref 135–145)
Total Bilirubin: 0.6 mg/dL (ref 0.0–1.2)
Total Protein: 7.5 g/dL (ref 6.5–8.1)

## 2024-04-09 LAB — CBC
HCT: 50 % — ABNORMAL HIGH (ref 36.0–46.0)
Hemoglobin: 16.8 g/dL — ABNORMAL HIGH (ref 12.0–15.0)
MCH: 29.8 pg (ref 26.0–34.0)
MCHC: 33.6 g/dL (ref 30.0–36.0)
MCV: 88.7 fL (ref 80.0–100.0)
Platelets: 357 K/uL (ref 150–400)
RBC: 5.64 MIL/uL — ABNORMAL HIGH (ref 3.87–5.11)
RDW: 12.5 % (ref 11.5–15.5)
WBC: 17.1 K/uL — ABNORMAL HIGH (ref 4.0–10.5)
nRBC: 0 % (ref 0.0–0.2)

## 2024-04-09 LAB — URINALYSIS, ROUTINE W REFLEX MICROSCOPIC
Bilirubin Urine: NEGATIVE
Glucose, UA: NEGATIVE mg/dL
Hgb urine dipstick: NEGATIVE
Ketones, ur: NEGATIVE mg/dL
Nitrite: POSITIVE — AB
Protein, ur: 100 mg/dL — AB
Specific Gravity, Urine: 1.024 (ref 1.005–1.030)
pH: 5 (ref 5.0–8.0)

## 2024-04-09 LAB — POC URINE PREG, ED: Preg Test, Ur: NEGATIVE

## 2024-04-09 MED ORDER — SODIUM CHLORIDE 0.9 % IV SOLN
1.0000 g | Freq: Once | INTRAVENOUS | Status: AC
  Administered 2024-04-09: 1 g via INTRAVENOUS
  Filled 2024-04-09: qty 10

## 2024-04-09 MED ORDER — ONDANSETRON HCL 4 MG/2ML IJ SOLN
4.0000 mg | Freq: Once | INTRAMUSCULAR | Status: AC
  Administered 2024-04-10: 4 mg via INTRAVENOUS
  Filled 2024-04-09: qty 2

## 2024-04-09 MED ORDER — SODIUM CHLORIDE 0.9 % IV BOLUS
1000.0000 mL | Freq: Once | INTRAVENOUS | Status: AC
  Administered 2024-04-09: 1000 mL via INTRAVENOUS

## 2024-04-09 NOTE — ED Provider Notes (Incomplete)
 "  [facility] Provider Note    Event Date/Time   First MD Initiated Contact with Patient 04/09/24 2308     (approximate)   History   Nausea   HPI  Tiffany Cuevas is a 38 y.o. female who presents to the emergency department today because concerns for right sided flank pain, nausea.  She states that her symptoms started around noon today.  The pain is located in her right flank and goes and into her right groin.  It is described as a pulsating sharp type of pain.  This was accompanied by nausea and vomiting.  Patient denies any change in defecation or urination recently.  No fevers.  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    Physical Exam   Triage Vital Signs: ED Triage Vitals  Encounter Vitals Group     BP 04/09/24 1837 125/84     Girls Systolic BP Percentile --      Girls Diastolic BP Percentile --      Boys Systolic BP Percentile --      Boys Diastolic BP Percentile --      Pulse Rate 04/09/24 1837 (!) 106     Resp 04/09/24 1837 16     Temp 04/09/24 1837 (!) 97.5 F (36.4 C)     Temp Source 04/09/24 1837 Oral     SpO2 04/09/24 1837 100 %     Weight 04/09/24 1838 190 lb (86.2 kg)     Height 04/09/24 1838 5' 3 (1.6 m)     Head Circumference --      Peak Flow --      Pain Score 04/09/24 1837 7     Pain Loc --      Pain Education --      Exclude from Growth Chart --     Most recent vital signs: Vitals:   04/09/24 1837 04/09/24 2147  BP: 125/84 125/79  Pulse: (!) 106 (!) 117  Resp: 16 20  Temp: (!) 97.5 F (36.4 C) 98.2 F (36.8 C)  SpO2: 100% 97%   General: Awake, alert, oriented. CV:  Good peripheral perfusion. Regular rate and rhythm. Resp:  Normal effort. Lungs clear. Abd:  No distention.   ED Results / Procedures / Treatments   Labs (all labs ordered are listed, but only abnormal results are displayed) Labs Reviewed   COMPREHENSIVE METABOLIC PANEL WITH GFR - Abnormal; Notable for the following components:      Result Value   CO2 19 (*)    Glucose, Bld 102 (*)    All other components within normal limits  CBC - Abnormal; Notable for the following components:   WBC 17.1 (*)    RBC 5.64 (*)    Hemoglobin 16.8 (*)    HCT 50.0 (*)    All other components within normal limits  URINALYSIS, ROUTINE W REFLEX MICROSCOPIC - Abnormal; Notable for the following components:   Color, Urine AMBER (*)    APPearance HAZY (*)    Protein, ur 100 (*)    Nitrite POSITIVE (*)    Leukocytes,Ua TRACE (*)    Bacteria, UA MANY (*)    All other components within normal limits  LIPASE, BLOOD  POC URINE PREG, ED     EKG  ***   RADIOLOGY *** {USE THE WORD INTERPRETED!! You MUST document your own interpretation of imaging, as well as  the fact that you reviewed the radiologist's report!:1}   PROCEDURES:  Critical Care performed: Yes  CRITICAL CARE Performed by: Guadalupe Eagles   Total critical care time: *** minutes  Critical care time was exclusive of separately billable procedures and treating other patients.  Critical care was necessary to treat or prevent imminent or life-threatening deterioration.  Critical care was time spent personally by me on the following activities: development of treatment plan with patient and/or surrogate as well as nursing, discussions with consultants, evaluation of patient's response to treatment, examination of patient, obtaining history from patient or surrogate, ordering and performing treatments and interventions, ordering and review of laboratory studies, ordering and review of radiographic studies, pulse oximetry and re-evaluation of patient's condition.   Procedures    MEDICATIONS ORDERED IN ED: Medications - No data to display   IMPRESSION / MDM / ASSESSMENT AND PLAN / ED COURSE  I reviewed the triage vital signs and the nursing notes.                               Differential diagnosis includes, but is not limited to, ***  Patient's presentation is most consistent with {EM COPA:27473}   ***The patient is on the cardiac monitor to evaluate for evidence of arrhythmia and/or significant heart rate changes.  ***      FINAL CLINICAL IMPRESSION(S) / ED DIAGNOSES   Final diagnoses:  None        Rx / DC Orders   ED Discharge Orders     None        Note:  This document was prepared using Dragon voice recognition software and may include unintentional dictation errors.  "

## 2024-04-09 NOTE — ED Triage Notes (Signed)
 Pt presents to ED from home C/O nausea since noon, severe R sided pain from rib cage radiating to R hip X 2 hours. States, the last time I had pain like this I had an ovarian cyst that twisted my ovary. Denies urinary symptoms.

## 2024-04-09 NOTE — ED Notes (Signed)
 Pt had syncopal episode and vomited after blood draw. Pt notified staff that she did not feel well, thought she may pass out, became pale and diaphoretic then had brief LOC lasting 2-3 seconds. Pt reports hx of same. After patient came to, denied dizziness or lightheadedness, states feeling better after event. First nurse made aware. Pt ambulatory to lobby.

## 2024-04-09 NOTE — ED Provider Notes (Signed)
 "  [name]    Event Date/Time   First MD Initiated Contact with Patient 04/09/24 2308     (approximate)   History   Nausea   HPI  Tiffany Cuevas is a 38 y.o. female who presents to the emergency department today because concerns for right sided flank pain, nausea.  She states that her symptoms started around noon today.  The pain is located in her right flank and goes and into her right groin.  It is described as a pulsating sharp type of pain.  This was accompanied by nausea and vomiting.  Patient denies any change in defecation or urination recently.  No fevers.  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    Physical Exam   Triage Vital Signs: ED Triage Vitals  Encounter Vitals Group     BP 04/09/24 1837 125/84     Girls Systolic BP Percentile --      Girls Diastolic BP Percentile --      Boys Systolic BP Percentile --      Boys Diastolic BP Percentile --      Pulse Rate 04/09/24 1837 (!) 106     Resp 04/09/24 1837 16     Temp 04/09/24 1837 (!) 97.5 F (36.4 C)     Temp Source 04/09/24 1837 Oral     SpO2 04/09/24 1837 100 %     Weight 04/09/24 1838 190 lb (86.2 kg)     Height 04/09/24 1838 5' 3 (1.6 m)     Head Circumference --      Peak Flow --      Pain Score 04/09/24 1837 7     Pain Loc --      Pain Education --      Exclude from Growth Chart --     Most recent vital signs: Vitals:   04/09/24 1837 04/09/24 2147  BP: 125/84 125/79  Pulse: (!) 106 (!) 117  Resp: 16 20  Temp: (!) 97.5 F (36.4 C) 98.2 F (36.8 C)  SpO2: 100% 97%   General: Awake, alert, oriented. CV:  Good peripheral perfusion. Regular rate and rhythm. Resp:  Normal effort. Lungs clear. Abd:  No distention.   ED Results / Procedures / Treatments   Labs (all labs ordered are listed, but only abnormal results are displayed) Labs Reviewed   COMPREHENSIVE METABOLIC PANEL WITH GFR - Abnormal; Notable for the following components:      Result Value   CO2 19 (*)    Glucose, Bld 102 (*)    All other components within normal limits  CBC - Abnormal; Notable for the following components:   WBC 17.1 (*)    RBC 5.64 (*)    Hemoglobin 16.8 (*)    HCT 50.0 (*)    All other components within normal limits  URINALYSIS, ROUTINE W REFLEX MICROSCOPIC - Abnormal; Notable for the following components:   Color, Urine AMBER (*)    APPearance HAZY (*)    Protein, ur 100 (*)    Nitrite POSITIVE (*)    Leukocytes,Ua TRACE (*)    Bacteria, UA MANY (*)    All other components within normal limits  LIPASE, BLOOD  POC URINE PREG, ED     EKG  None   RADIOLOGY I independently interpreted and visualized the US  pelvis. My interpretation: No large amount of free fluid  Radiology interpretation:  IMPRESSION:  1. No acute pelvic abnormality.  2. Intrauterine device in appropriate position.    I independently interpreted and visualized the CT abd/pel. My interpretation: No inflammation, no free air Radiology interpretation:  IMPRESSION:  1. No acute findings in the abdomen or pelvis.  2. Diffuse hepatic steatosis.  3. Mild sigmoid diverticulosis.       PROCEDURES:  Critical Care performed: No   MEDICATIONS ORDERED IN ED: Medications - No data to display   IMPRESSION / MDM / ASSESSMENT AND PLAN / ED COURSE  I reviewed the triage vital signs and the nursing notes.                              Differential diagnosis includes, but is not limited to, appendicitis, pyelonephritis, kidney stone, MSK pain  Patient's presentation is most consistent with acute presentation with potential threat to life or bodily function.   Patient presented to the emergency department today because concerns for right-sided flank pain that radiates down to her groin.  On exam she does have some tenderness over that area.  UA is concerning for  infection.  Patient does have a leukocytosis.  Did obtain a CT scan to evaluate for possible ureteral stone or pyelonephritis.  No concerning findings.  Additionally got ultrasound given history of ovarian torsion in the past.  Patient was given dose of IV antibiotics here.  I do think be reasonable for patient be discharged at this time and put on oral antibiotics.  I discussed this with patient.  Discussed return precautions.      FINAL CLINICAL IMPRESSION(S) / ED DIAGNOSES   Final diagnoses:  Urinary tract infection without hematuria, site unspecified     Note:  This document was prepared using Dragon voice recognition software and may include unintentional dictation errors.    Tiffany Roberts, MD 04/10/24 [phone]  "

## 2024-04-10 ENCOUNTER — Emergency Department

## 2024-04-10 MED ORDER — ONDANSETRON 4 MG PO TBDP: 4.0000 mg | ORAL_TABLET | Freq: Three times a day (TID) | ORAL | 0 refills | Status: AC | PRN

## 2024-04-10 MED ORDER — PHENAZOPYRIDINE HCL 100 MG PO TABS: 100.0000 mg | ORAL_TABLET | Freq: Three times a day (TID) | ORAL | 0 refills | Status: AC | PRN

## 2024-04-10 MED ORDER — IOHEXOL 300 MG/ML  SOLN
100.0000 mL | Freq: Once | INTRAMUSCULAR | Status: AC | PRN
  Administered 2024-04-10: 100 mL via INTRAVENOUS

## 2024-04-10 MED ORDER — CEPHALEXIN 500 MG PO CAPS: 500.0000 mg | ORAL_CAPSULE | Freq: Three times a day (TID) | ORAL | 0 refills | Status: AC
